# Patient Record
Sex: Male | Born: 1960 | Race: White | Hispanic: No | Marital: Married | Smoking: Former smoker
Health system: Southern US, Community
[De-identification: ages and names within clinical notes are randomized; demographics above are authoritative.]

## PROBLEM LIST (undated history)

## (undated) DIAGNOSIS — E119 Type 2 diabetes mellitus without complications: Secondary | ICD-10-CM

## (undated) DIAGNOSIS — J45909 Unspecified asthma, uncomplicated: Secondary | ICD-10-CM

## (undated) DIAGNOSIS — I2699 Other pulmonary embolism without acute cor pulmonale: Secondary | ICD-10-CM

## (undated) DIAGNOSIS — G4730 Sleep apnea, unspecified: Secondary | ICD-10-CM

## (undated) DIAGNOSIS — I10 Essential (primary) hypertension: Secondary | ICD-10-CM

## (undated) DIAGNOSIS — J449 Chronic obstructive pulmonary disease, unspecified: Secondary | ICD-10-CM

## (undated) DIAGNOSIS — F319 Bipolar disorder, unspecified: Secondary | ICD-10-CM

## (undated) HISTORY — DX: Type 2 diabetes mellitus without complications: E11.9

## (undated) HISTORY — PX: APPENDECTOMY: SHX54

## (undated) HISTORY — DX: Essential (primary) hypertension: I10

## (undated) HISTORY — DX: Chronic obstructive pulmonary disease, unspecified: J44.9

## (undated) HISTORY — DX: Sleep apnea, unspecified: G47.30

## (undated) HISTORY — DX: Other pulmonary embolism without acute cor pulmonale: I26.99

---

## 2001-02-19 ENCOUNTER — Encounter: Payer: Self-pay | Admitting: *Deleted

## 2001-02-19 ENCOUNTER — Emergency Department (HOSPITAL_COMMUNITY): Admission: EM | Admit: 2001-02-19 | Discharge: 2001-02-19 | Payer: Self-pay | Admitting: *Deleted

## 2001-07-12 ENCOUNTER — Emergency Department (HOSPITAL_COMMUNITY): Admission: EM | Admit: 2001-07-12 | Discharge: 2001-07-12 | Payer: Self-pay | Admitting: *Deleted

## 2001-07-30 ENCOUNTER — Ambulatory Visit (HOSPITAL_COMMUNITY): Admission: RE | Admit: 2001-07-30 | Discharge: 2001-07-30 | Payer: Self-pay | Admitting: Unknown Physician Specialty

## 2001-07-30 ENCOUNTER — Encounter: Payer: Self-pay | Admitting: Pediatrics

## 2002-09-21 ENCOUNTER — Emergency Department (HOSPITAL_COMMUNITY): Admission: EM | Admit: 2002-09-21 | Discharge: 2002-09-21 | Payer: Self-pay | Admitting: *Deleted

## 2002-09-30 ENCOUNTER — Emergency Department (HOSPITAL_COMMUNITY): Admission: EM | Admit: 2002-09-30 | Discharge: 2002-09-30 | Payer: Self-pay | Admitting: Emergency Medicine

## 2002-10-15 ENCOUNTER — Emergency Department (HOSPITAL_COMMUNITY): Admission: EM | Admit: 2002-10-15 | Discharge: 2002-10-15 | Payer: Self-pay | Admitting: Internal Medicine

## 2004-06-01 ENCOUNTER — Emergency Department (HOSPITAL_COMMUNITY): Admission: EM | Admit: 2004-06-01 | Discharge: 2004-06-01 | Payer: Self-pay | Admitting: Emergency Medicine

## 2005-03-21 ENCOUNTER — Emergency Department (HOSPITAL_COMMUNITY): Admission: EM | Admit: 2005-03-21 | Discharge: 2005-03-21 | Payer: Self-pay | Admitting: Emergency Medicine

## 2005-03-30 ENCOUNTER — Ambulatory Visit (HOSPITAL_COMMUNITY): Admission: RE | Admit: 2005-03-30 | Discharge: 2005-03-30 | Payer: Self-pay | Admitting: Emergency Medicine

## 2005-04-18 ENCOUNTER — Ambulatory Visit (HOSPITAL_COMMUNITY): Admission: RE | Admit: 2005-04-18 | Discharge: 2005-04-18 | Payer: Self-pay | Admitting: Emergency Medicine

## 2006-02-13 ENCOUNTER — Emergency Department (HOSPITAL_COMMUNITY): Admission: EM | Admit: 2006-02-13 | Discharge: 2006-02-13 | Payer: Self-pay | Admitting: Emergency Medicine

## 2006-07-17 ENCOUNTER — Emergency Department (HOSPITAL_COMMUNITY): Admission: EM | Admit: 2006-07-17 | Discharge: 2006-07-17 | Payer: Self-pay | Admitting: Emergency Medicine

## 2006-08-09 ENCOUNTER — Emergency Department (HOSPITAL_COMMUNITY): Admission: EM | Admit: 2006-08-09 | Discharge: 2006-08-09 | Payer: Self-pay | Admitting: Emergency Medicine

## 2007-08-30 ENCOUNTER — Emergency Department (HOSPITAL_COMMUNITY): Admission: EM | Admit: 2007-08-30 | Discharge: 2007-08-30 | Payer: Self-pay | Admitting: Emergency Medicine

## 2008-06-16 ENCOUNTER — Emergency Department (HOSPITAL_COMMUNITY): Admission: EM | Admit: 2008-06-16 | Discharge: 2008-06-16 | Payer: Self-pay | Admitting: Emergency Medicine

## 2008-09-01 ENCOUNTER — Emergency Department (HOSPITAL_COMMUNITY): Admission: EM | Admit: 2008-09-01 | Discharge: 2008-09-01 | Payer: Self-pay | Admitting: Emergency Medicine

## 2009-03-28 ENCOUNTER — Emergency Department (HOSPITAL_COMMUNITY): Admission: EM | Admit: 2009-03-28 | Discharge: 2009-03-28 | Payer: Self-pay | Admitting: Emergency Medicine

## 2009-04-01 ENCOUNTER — Emergency Department (HOSPITAL_COMMUNITY): Admission: EM | Admit: 2009-04-01 | Discharge: 2009-04-01 | Payer: Self-pay | Admitting: Emergency Medicine

## 2009-05-14 ENCOUNTER — Emergency Department (HOSPITAL_COMMUNITY): Admission: EM | Admit: 2009-05-14 | Discharge: 2009-05-14 | Payer: Self-pay | Admitting: Emergency Medicine

## 2009-06-23 ENCOUNTER — Emergency Department (HOSPITAL_COMMUNITY): Admission: EM | Admit: 2009-06-23 | Discharge: 2009-06-23 | Payer: Self-pay | Admitting: Emergency Medicine

## 2010-01-18 ENCOUNTER — Emergency Department (HOSPITAL_COMMUNITY): Admission: EM | Admit: 2010-01-18 | Discharge: 2010-01-18 | Payer: Self-pay | Admitting: Emergency Medicine

## 2010-06-24 ENCOUNTER — Emergency Department (HOSPITAL_COMMUNITY): Admission: EM | Admit: 2010-06-24 | Discharge: 2010-06-24 | Payer: Self-pay | Admitting: Emergency Medicine

## 2010-10-10 ENCOUNTER — Emergency Department (HOSPITAL_COMMUNITY)
Admission: EM | Admit: 2010-10-10 | Discharge: 2010-10-10 | Payer: Self-pay | Source: Home / Self Care | Admitting: Emergency Medicine

## 2011-01-19 LAB — GLUCOSE, CAPILLARY: Glucose-Capillary: 128 mg/dL — ABNORMAL HIGH (ref 70–99)

## 2011-02-04 LAB — URINALYSIS, ROUTINE W REFLEX MICROSCOPIC
Nitrite: NEGATIVE
Protein, ur: NEGATIVE mg/dL
pH: 5.5 (ref 5.0–8.0)

## 2011-02-04 LAB — GLUCOSE, CAPILLARY: Glucose-Capillary: 103 mg/dL — ABNORMAL HIGH (ref 70–99)

## 2011-03-16 ENCOUNTER — Emergency Department (HOSPITAL_COMMUNITY)
Admission: EM | Admit: 2011-03-16 | Discharge: 2011-03-16 | Disposition: A | Discharge status: Home / Self Care | Payer: Self-pay | Attending: Emergency Medicine | Admitting: Emergency Medicine

## 2011-03-16 ENCOUNTER — Emergency Department (HOSPITAL_COMMUNITY): Payer: Self-pay

## 2011-03-16 DIAGNOSIS — R079 Chest pain, unspecified: Secondary | ICD-10-CM | POA: Insufficient documentation

## 2011-03-16 DIAGNOSIS — M79609 Pain in unspecified limb: Secondary | ICD-10-CM | POA: Insufficient documentation

## 2011-03-16 LAB — CBC
HCT: 43.5 % (ref 39.0–52.0)
Hemoglobin: 14.9 g/dL (ref 13.0–17.0)
MCH: 30.2 pg (ref 26.0–34.0)
MCV: 88.2 fL (ref 78.0–100.0)

## 2011-03-16 LAB — DIFFERENTIAL
Basophils Relative: 0 % (ref 0–1)
Eosinophils Absolute: 0.2 10*3/uL (ref 0.0–0.7)
Lymphocytes Relative: 23 % (ref 12–46)
Monocytes Absolute: 0.7 10*3/uL (ref 0.1–1.0)
Monocytes Relative: 7 % (ref 3–12)
Neutrophils Relative %: 68 % (ref 43–77)

## 2011-03-16 LAB — BASIC METABOLIC PANEL
BUN: 8 mg/dL (ref 6–23)
CO2: 25 mEq/L (ref 19–32)
Calcium: 9.7 mg/dL (ref 8.4–10.5)
GFR calc Af Amer: >60 mL/min (ref >60)
Potassium: 3.7 mEq/L (ref 3.5–5.1)
Sodium: 137 mEq/L (ref 135–145)

## 2011-03-16 LAB — POCT CARDIAC MARKERS: Myoglobin, poc: 70.2 ng/mL (ref 12–200)

## 2011-03-16 NOTE — Consult Note (Signed)
[facility]  Patient:    Gary Burke, Gary Burke Visit Number: [id number] MRN: [id number]          Service Type: Attending:  Barbaraann Barthel, M.D. Dictated by:   Barbaraann Barthel, M.D. Proc. Date: 07/31/01   CC:         free clinic   Consultation Report  Dear free clinic:  I saw Gary Burke in my office on July 31, 2001 at which time I evaluated his perianal area and intergluteal space.  In essence, he has an area of tenderness; however, he has very minimal erythema and I do not palpate either a cyst or an abscess in this area.  I do not believe that he has a pilonidal abscess or a cyst at this time.  He may have presented with one earlier the antibiotics have resolved any cellulitis that he has.  I told him to continue warm soaks and a weeks course of antibiotics and I will reevaluate him to see if surgery is necessary.  He is told to contact us if he has any acute change which would prompt me to see him earlier.  At any rate, I do not believe surgery is necessary at this time.  Thank you for sending him my way.  I am, as always, happy to help the free clinic. Dictated by:   Barbaraann Barthel, M.D. Attending:  Barbaraann Barthel, M.D. DD:  07/31/01 TD:  08/01/01 Job: [id number]

## 2011-03-18 ENCOUNTER — Emergency Department (HOSPITAL_COMMUNITY): Payer: Self-pay

## 2011-03-18 ENCOUNTER — Emergency Department (HOSPITAL_COMMUNITY)
Admission: EM | Admit: 2011-03-18 | Discharge: 2011-03-18 | Disposition: A | Discharge status: Home / Self Care | Payer: Self-pay | Attending: Emergency Medicine | Admitting: Emergency Medicine

## 2011-03-18 DIAGNOSIS — F3130 Bipolar disorder, current episode depressed, mild or moderate severity, unspecified: Secondary | ICD-10-CM | POA: Insufficient documentation

## 2011-03-18 DIAGNOSIS — R079 Chest pain, unspecified: Secondary | ICD-10-CM | POA: Insufficient documentation

## 2011-03-18 DIAGNOSIS — Z79899 Other long term (current) drug therapy: Secondary | ICD-10-CM | POA: Insufficient documentation

## 2011-03-18 LAB — DIFFERENTIAL
Basophils Absolute: 0 10*3/uL (ref 0.0–0.1)
Basophils Relative: 0 % (ref 0–1)
Lymphs Abs: 3.3 10*3/uL (ref 0.7–4.0)
Monocytes Relative: 8 % (ref 3–12)
Neutrophils Relative %: 54 % (ref 43–77)

## 2011-03-18 LAB — CBC
MCH: 30.3 pg (ref 26.0–34.0)
MCV: 88.6 fL (ref 78.0–100.0)
WBC: 9.8 10*3/uL (ref 4.0–10.5)

## 2011-03-18 LAB — POCT I-STAT, CHEM 8
Chloride: 107 mEq/L (ref 96–112)
TCO2: 24 mmol/L (ref 0–100)

## 2011-03-18 LAB — POCT CARDIAC MARKERS
CKMB, poc: <1 ng/mL — ABNORMAL LOW (ref 1.0–8.0)
Troponin i, poc: <0.05 ng/mL (ref 0.00–0.09)

## 2012-07-08 ENCOUNTER — Encounter (HOSPITAL_COMMUNITY): Payer: Self-pay | Admitting: *Deleted

## 2012-07-08 ENCOUNTER — Emergency Department (HOSPITAL_COMMUNITY)
Admission: EM | Admit: 2012-07-08 | Discharge: 2012-07-08 | Disposition: A | Discharge status: Home / Self Care | Payer: Self-pay | Attending: Emergency Medicine | Admitting: Emergency Medicine

## 2012-07-08 ENCOUNTER — Emergency Department (HOSPITAL_COMMUNITY): Payer: Self-pay

## 2012-07-08 DIAGNOSIS — J40 Bronchitis, not specified as acute or chronic: Secondary | ICD-10-CM | POA: Insufficient documentation

## 2012-07-08 DIAGNOSIS — Z9089 Acquired absence of other organs: Secondary | ICD-10-CM | POA: Insufficient documentation

## 2012-07-08 DIAGNOSIS — F17200 Nicotine dependence, unspecified, uncomplicated: Secondary | ICD-10-CM | POA: Insufficient documentation

## 2012-07-08 DIAGNOSIS — J45909 Unspecified asthma, uncomplicated: Secondary | ICD-10-CM | POA: Insufficient documentation

## 2012-07-08 HISTORY — DX: Bipolar disorder, unspecified: F31.9

## 2012-07-08 MED ORDER — HYDROCODONE-ACETAMINOPHEN 7.5-500 MG/15ML PO SOLN: 15.0000 mL | Freq: Four times a day (QID) | ORAL | Status: AC | PRN

## 2012-07-08 MED ORDER — ALBUTEROL SULFATE HFA 108 (90 BASE) MCG/ACT IN AERS
2.0000 | INHALATION_SPRAY | RESPIRATORY_TRACT | Status: DC | PRN
  Administered 2012-07-08: 2 via RESPIRATORY_TRACT
  Filled 2012-07-08: qty 6.7

## 2012-07-08 MED ORDER — ALBUTEROL SULFATE (2.5 MG/3ML) 0.083% IN NEBU: 2.5000 mg | INHALATION_SOLUTION | Freq: Four times a day (QID) | RESPIRATORY_TRACT | Status: DC | PRN

## 2012-07-08 MED ORDER — ALBUTEROL SULFATE (5 MG/ML) 0.5% IN NEBU
5.0000 mg | INHALATION_SOLUTION | Freq: Once | RESPIRATORY_TRACT | Status: AC
  Administered 2012-07-08: 5 mg via RESPIRATORY_TRACT
  Filled 2012-07-08: qty 1

## 2012-07-08 MED ORDER — HYDROCOD POLST-CHLORPHEN POLST 10-8 MG/5ML PO LQCR
5.0000 mL | Freq: Once | ORAL | Status: AC
  Administered 2012-07-08: 5 mL via ORAL
  Filled 2012-07-08: qty 5

## 2012-07-08 MED ORDER — IPRATROPIUM BROMIDE 0.02 % IN SOLN
0.5000 mg | Freq: Once | RESPIRATORY_TRACT | Status: AC
  Administered 2012-07-08: 0.5 mg via RESPIRATORY_TRACT
  Filled 2012-07-08: qty 2.5

## 2012-07-08 MED ORDER — AZITHROMYCIN 250 MG PO TABS: ORAL_TABLET | ORAL | Status: AC

## 2012-07-08 NOTE — ED Notes (Signed)
Pt c/o cough, with sputum production white to yellow in color, fever, chest pain when coughing. Pt states that he started getting sick on Sunday.

## 2012-07-08 NOTE — ED Provider Notes (Signed)
History    This chart was scribed for Gary Hutching, MD, MD by Smitty Pluck. The patient was seen in room APA09 and the patient's care was started at 10:14AM.   CSN: [id number]  Arrival date & time 07/08/12  0908   First MD Initiated Contact with Patient 07/08/12 1014      Chief Complaint  Patient presents with  . Cough  . Fever    (Consider location/radiation/quality/duration/timing/severity/associated sxs/prior treatment) Patient is a 51 y.o. male presenting with cough and fever. The history is provided by the patient. No language interpreter was used.  Cough  Fever Primary symptoms of the febrile illness include fever and cough.   JEOFFREY Burke is a 51 y.o. male who presents to the Emergency Department complaining of moderate productive cough causing lateral chest wall pain onset 2 days ago. Pt reports that he has mild intermittent wheezing and fever. Pt reports that he smokes 1 pack/week.   Pt does not have PCP  Past Medical History  Diagnosis Date  . Bipolar disorder     Past Surgical History  Procedure Date  . Appendectomy     No family history on file.  History  Substance Use Topics  . Smoking status: Current Some Day Smoker  . Smokeless tobacco: Not on file  . Alcohol Use: No      Review of Systems  Constitutional: Positive for fever.  Respiratory: Positive for cough.   All other systems reviewed and are negative.   10 Systems reviewed and all are negative for acute change except as noted in the HPI.   Allergies  Penicillins  Home Medications   Current Outpatient Rx  Name Route Sig Dispense Refill  . ACETAMINOPHEN 500 MG PO TABS Oral Take 500 mg by mouth every 6 (six) hours as needed. Pain    . DAYQUIL PO Oral Take 2 capsules by mouth every 4 (four) hours as needed. Cold Symptoms.      BP 147/96  Pulse 71  Temp 98.4 F (36.9 C)  Resp 20  Ht 6' (1.829 m)  Wt 280 lb (127.007 kg)  BMI 37.97 kg/m2  SpO2 97%  Physical Exam  Nursing note  and vitals reviewed. Constitutional: He is oriented to person, place, and time. He appears well-developed and well-nourished.  HENT:  Head: Normocephalic and atraumatic.  Eyes: Conjunctivae and EOM are normal. Pupils are equal, round, and reactive to light.  Neck: Normal range of motion. Neck supple.  Cardiovascular: Normal rate, regular rhythm and normal heart sounds.   Pulmonary/Chest: Effort normal. He has wheezes (minimal ).  Abdominal: Soft. Bowel sounds are normal.  Musculoskeletal: Normal range of motion.  Neurological: He is alert and oriented to person, place, and time.  Skin: Skin is warm and dry.  Psychiatric: He has a normal mood and affect.    ED Course  Procedures (including critical care time) DIAGNOSTIC STUDIES: Oxygen Saturation is 97% on room air, normal by my interpretation.    COORDINATION OF CARE: 10:17 AM Discussed pt ED treatment with pt (breathing treatment, abx, cough medication and inhaler, home nebulizer meds) 10:46 AM Ordered:   Medications  acetaminophen (TYLENOL) 500 MG tablet (not administered)  Pseudoephedrine-APAP-DM (DAYQUIL PO) (not administered)  albuterol (PROVENTIL HFA;VENTOLIN HFA) 108 (90 BASE) MCG/ACT inhaler 2 puff (2 puff Inhalation Given 07/08/12 1050)  albuterol (PROVENTIL) (5 MG/ML) 0.5% nebulizer solution 5 mg (5 mg Nebulization Given 07/08/12 1049)  ipratropium (ATROVENT) nebulizer solution 0.5 mg (0.5 mg Nebulization Given 07/08/12 1049)  chlorpheniramine-HYDROcodone (  TUSSIONEX) 10-8 MG/5ML suspension 5 mL (5 mL Oral Given 07/08/12 1039)       Labs Reviewed - No data to display Dg Chest 2 View  07/08/2012  *RADIOLOGY REPORT*  Clinical Data: Cough, congestion, smoker.  CHEST - 2 VIEW  Comparison: 03/18/2011  Findings: Heart is normal size.  There is peribronchial thickening, stable since prior study.  No confluent opacity or effusions.  No acute bony abnormality.  IMPRESSION: Stable chronic bronchitic changes.  No active disease.    Original Report Authenticated By: Cyndie Chime, M.D.      No diagnosis found.    MDM  Chest x-ray shows no pneumonia.  History and physical consistent with bronchitis.  Rx Zithromax, albuterol inhaler, Lortab elixir for cough      I personally performed the services described in this documentation, which was scribed in my presence. The recorded information has been reviewed and considered.    Gary Hutching, MD 07/08/12 1153

## 2012-07-08 NOTE — ED Notes (Signed)
Pt up in bathroom, family member at bedside. Awaiting d/c papers

## 2012-10-08 ENCOUNTER — Emergency Department (HOSPITAL_COMMUNITY): Payer: Self-pay

## 2012-10-08 ENCOUNTER — Emergency Department (HOSPITAL_COMMUNITY)
Admission: EM | Admit: 2012-10-08 | Discharge: 2012-10-08 | Disposition: A | Discharge status: Home / Self Care | Payer: Self-pay | Attending: Emergency Medicine | Admitting: Emergency Medicine

## 2012-10-08 ENCOUNTER — Encounter (HOSPITAL_COMMUNITY): Payer: Self-pay | Admitting: Emergency Medicine

## 2012-10-08 DIAGNOSIS — Y9301 Activity, walking, marching and hiking: Secondary | ICD-10-CM | POA: Insufficient documentation

## 2012-10-08 DIAGNOSIS — Y929 Unspecified place or not applicable: Secondary | ICD-10-CM | POA: Insufficient documentation

## 2012-10-08 DIAGNOSIS — F319 Bipolar disorder, unspecified: Secondary | ICD-10-CM | POA: Insufficient documentation

## 2012-10-08 DIAGNOSIS — Z7982 Long term (current) use of aspirin: Secondary | ICD-10-CM | POA: Insufficient documentation

## 2012-10-08 DIAGNOSIS — F17200 Nicotine dependence, unspecified, uncomplicated: Secondary | ICD-10-CM | POA: Insufficient documentation

## 2012-10-08 DIAGNOSIS — S335XXA Sprain of ligaments of lumbar spine, initial encounter: Secondary | ICD-10-CM | POA: Insufficient documentation

## 2012-10-08 DIAGNOSIS — W010XXA Fall on same level from slipping, tripping and stumbling without subsequent striking against object, initial encounter: Secondary | ICD-10-CM | POA: Insufficient documentation

## 2012-10-08 DIAGNOSIS — S39012A Strain of muscle, fascia and tendon of lower back, initial encounter: Secondary | ICD-10-CM

## 2012-10-08 MED ORDER — OXYCODONE-ACETAMINOPHEN 5-325 MG PO TABS: 1.0000 | ORAL_TABLET | ORAL | Status: AC | PRN

## 2012-10-08 MED ORDER — CYCLOBENZAPRINE HCL 10 MG PO TABS: 10.0000 mg | ORAL_TABLET | Freq: Three times a day (TID) | ORAL | Status: DC | PRN

## 2012-10-08 MED ORDER — CYCLOBENZAPRINE HCL 10 MG PO TABS
10.0000 mg | ORAL_TABLET | Freq: Once | ORAL | Status: AC
  Administered 2012-10-08: 10 mg via ORAL
  Filled 2012-10-08: qty 1

## 2012-10-08 MED ORDER — OXYCODONE-ACETAMINOPHEN 5-325 MG PO TABS
1.0000 | ORAL_TABLET | Freq: Once | ORAL | Status: AC
  Administered 2012-10-08: 1 via ORAL
  Filled 2012-10-08: qty 1

## 2012-10-08 NOTE — ED Notes (Signed)
Pt slid and fell yesterday onto right back and buttock.

## 2012-10-08 NOTE — ED Provider Notes (Signed)
History     CSN: [id number]  Arrival date & time 10/08/12  1521   First MD Initiated Contact with Patient 10/08/12 1604      Chief Complaint  Patient presents with  . Fall    (Consider location/radiation/quality/duration/timing/severity/associated sxs/prior treatment) HPI Comments: Patient c/o pain to his right lower back and buttocks after slipping on some ice.  States the pain is worse with certain movements and improves with rest.  He has tried OTC Tylenol w/o improvement.  He denies radiation of pain, dysuria, incontinence, saddle anesthesia's, abd pain, extremity pain or numbness.  He also denies neck pain, head injury or LOC.   Patient is a 51 y.o. male presenting with fall. The history is provided by the patient.  Fall The accident occurred yesterday. The fall occurred while walking. He landed on concrete. There was no blood loss. Point of impact: right lower back and buttocks. Pain location: right lower back and buttocks. The pain is moderate. He was ambulatory at the scene. There was no entrapment after the fall. There was no drug use involved in the accident. There was no alcohol use involved in the accident. Pertinent negatives include no fever, no numbness, no abdominal pain, no bowel incontinence, no nausea, no vomiting, no hematuria, no headaches, no hearing loss, no loss of consciousness and no tingling. The symptoms are aggravated by activity, ambulation, sitting and pressure on the injury. He has tried acetaminophen for the symptoms. The treatment provided no relief.    Past Medical History  Diagnosis Date  . Bipolar disorder     Past Surgical History  Procedure Date  . Appendectomy     No family history on file.  History  Substance Use Topics  . Smoking status: Current Some Day Smoker  . Smokeless tobacco: Not on file  . Alcohol Use: No      Review of Systems  Constitutional: Negative for fever and activity change.  HENT: Negative for neck pain.     Respiratory: Negative for shortness of breath.   Cardiovascular: Negative for chest pain.  Gastrointestinal: Negative for nausea, vomiting, abdominal pain and bowel incontinence.  Genitourinary: Negative for dysuria, hematuria, flank pain, decreased urine volume and difficulty urinating.       No perineal numbness or incontinence of urine or feces  Musculoskeletal: Positive for back pain. Negative for joint swelling and gait problem.  Skin: Negative for rash.  Neurological: Negative for dizziness, tingling, loss of consciousness, weakness, numbness and headaches.  All other systems reviewed and are negative.    Allergies  Penicillins  Home Medications   Current Outpatient Rx  Name  Route  Sig  Dispense  Refill  . ACETAMINOPHEN 500 MG PO TABS   Oral   Take 500 mg by mouth every 6 (six) hours as needed. Pain         . ALBUTEROL SULFATE (2.5 MG/3ML) 0.083% IN NEBU   Nebulization   Take 3 mLs (2.5 mg total) by nebulization every 6 (six) hours as needed for wheezing.   75 mL   12   . DAYQUIL PO   Oral   Take 2 capsules by mouth every 4 (four) hours as needed. Cold Symptoms.           BP 125/79  Pulse 106  Temp 97.9 F (36.6 C) (Oral)  Resp 20  Ht 6' (1.829 m)  Wt 280 lb (127.007 kg)  BMI 37.97 kg/m2  SpO2 96%  Physical Exam  Nursing note and vitals reviewed.  Constitutional: He is oriented to person, place, and time. He appears well-developed and well-nourished. No distress.  HENT:  Head: Normocephalic and atraumatic.  Neck: Normal range of motion. Neck supple.  Cardiovascular: Normal rate, regular rhythm, normal heart sounds and intact distal pulses.   No murmur heard. Pulmonary/Chest: Effort normal and breath sounds normal. He exhibits no tenderness.  Abdominal: He exhibits no distension and no mass. There is no tenderness. There is no rebound and no guarding.  Musculoskeletal: He exhibits tenderness. He exhibits no edema.       Lumbar back: He exhibits  tenderness and pain. He exhibits normal range of motion, no swelling, no deformity, no laceration and normal pulse.       Back:       ttp of the right lumbar paraspinal muscles and SI joint.  Dp pulses are equal and brisk, distal sensation intact.  Pt has full ROM of the bilateral hips, nml SLR exam  Neurological: He is alert and oriented to person, place, and time. No sensory deficit. He exhibits normal muscle tone. Coordination and gait normal.  Reflex Scores:      Patellar reflexes are 2+ on the right side and 2+ on the left side.      Achilles reflexes are 2+ on the right side and 2+ on the left side. Skin: Skin is warm and dry.    ED Course  Procedures (including critical care time)  Labs Reviewed - No data to display No results found.   Dg Lumbar Spine Complete  10/08/2012  *RADIOLOGY REPORT*  Clinical Data: Fall 1 day ago.  Low back pain.  Left gluteal pain.  LUMBAR SPINE - COMPLETE 4+ VIEW  Comparison: None.  Findings: Lumbar spinal alignment anatomic.  Mild to moderate multilevel thoracolumbar spondylosis is present with degenerative endplate changes and endplate spurring.  There are no pars defects. Five lumbar type vertebral bodies are present.  Lumbosacral junction appears within normal limits.  Lower lumbar facet arthrosis.  Vertebral body height is preserved.  IMPRESSION: No acute abnormality.  Mild to moderate multilevel lumbar spondylosis.   Original Report Authenticated By: Andreas Newport, M.D.      MDM     Patient has ttp of the right lumbar paraspinal muscles and SI joint.  No focal neuro deficits on exam.  Ambulates with a steady gait.     Given po percocet and flexeril in dept, pain improved.  Likely musculoskeletal injury.  Doubt emergent neurological or infectious process  Pt agrees to close f/u with his PMD for recheck   Prescribed: Flexeril Percocet #15   Rashon Rezek L. Jaylise Peek, [place] 10/08/12 1728

## 2012-10-11 NOTE — ED Provider Notes (Signed)
Medical screening examination/treatment/procedure(s) were performed by non-physician practitioner and as supervising physician I was immediately available for consultation/collaboration.   Joya Gaskins, MD 10/11/12 [phone]

## 2012-12-07 ENCOUNTER — Emergency Department (HOSPITAL_COMMUNITY): Payer: Self-pay

## 2012-12-07 ENCOUNTER — Emergency Department (HOSPITAL_COMMUNITY)
Admission: EM | Admit: 2012-12-07 | Discharge: 2012-12-07 | Disposition: A | Discharge status: Home / Self Care | Payer: Self-pay | Attending: Emergency Medicine | Admitting: Emergency Medicine

## 2012-12-07 ENCOUNTER — Encounter (HOSPITAL_COMMUNITY): Payer: Self-pay

## 2012-12-07 DIAGNOSIS — J329 Chronic sinusitis, unspecified: Secondary | ICD-10-CM | POA: Insufficient documentation

## 2012-12-07 DIAGNOSIS — J069 Acute upper respiratory infection, unspecified: Secondary | ICD-10-CM | POA: Insufficient documentation

## 2012-12-07 DIAGNOSIS — Z8659 Personal history of other mental and behavioral disorders: Secondary | ICD-10-CM | POA: Insufficient documentation

## 2012-12-07 DIAGNOSIS — F17200 Nicotine dependence, unspecified, uncomplicated: Secondary | ICD-10-CM | POA: Insufficient documentation

## 2012-12-07 DIAGNOSIS — J3489 Other specified disorders of nose and nasal sinuses: Secondary | ICD-10-CM | POA: Insufficient documentation

## 2012-12-07 DIAGNOSIS — IMO0001 Reserved for inherently not codable concepts without codable children: Secondary | ICD-10-CM | POA: Insufficient documentation

## 2012-12-07 DIAGNOSIS — J029 Acute pharyngitis, unspecified: Secondary | ICD-10-CM | POA: Insufficient documentation

## 2012-12-07 MED ORDER — PROMETHAZINE-CODEINE 6.25-10 MG/5ML PO SYRP: 5.0000 mL | ORAL_SOLUTION | ORAL | Status: DC | PRN

## 2012-12-07 MED ORDER — PSEUDOEPHEDRINE HCL 60 MG PO TABS: ORAL_TABLET | ORAL | Status: DC

## 2012-12-07 NOTE — ED Notes (Signed)
Flu-like symptoms for 4 days, has been taking otc meds with no relief

## 2012-12-07 NOTE — ED Provider Notes (Signed)
History     CSN: [id number]  Arrival date & time 12/07/12  1046   First MD Initiated Contact with Patient 12/07/12 1227      Chief Complaint  Patient presents with  . Influenza    (Consider location/radiation/quality/duration/timing/severity/associated sxs/prior treatment) Patient is a 52 y.o. male presenting with flu symptoms. The history is provided by the patient.  Influenza Presenting symptoms: cough, myalgias, rhinorrhea and sore throat   Presenting symptoms: no diarrhea, no fever, no headaches and no nausea   Severity:  Moderate Onset quality:  Gradual Progression:  Unchanged Chronicity:  New Relieved by:  Nothing Worsened by:  Nothing tried Ineffective treatments:  Hot fluids and rest Associated symptoms: nasal congestion   Associated symptoms: no chills, no decreased appetite, no decrease in physical activity, no ear pain, no neck stiffness and no witnessed syncope   Risk factors: sick contacts     Past Medical History  Diagnosis Date  . Bipolar disorder     Past Surgical History  Procedure Laterality Date  . Appendectomy      No family history on file.  History  Substance Use Topics  . Smoking status: Current Some Day Smoker    Types: Cigarettes  . Smokeless tobacco: Not on file  . Alcohol Use: No      Review of Systems  Constitutional: Negative for fever, chills and decreased appetite.  HENT: Positive for congestion, sore throat and rhinorrhea. Negative for ear pain and neck stiffness.   Respiratory: Positive for cough.   Gastrointestinal: Negative for nausea and diarrhea.  Musculoskeletal: Positive for myalgias.  Neurological: Negative for headaches.    Allergies  Penicillins  Home Medications  No current outpatient prescriptions on file.  BP 137/93  Pulse 98  Temp(Src) 97.9 F (36.6 C) (Oral)  Resp 20  Ht 6' (1.829 m)  Wt 280 lb (127.007 kg)  BMI 37.97 kg/m2  SpO2 97%  Physical Exam  Nursing note and vitals  reviewed. Constitutional: He is oriented to person, place, and time. He appears well-developed and well-nourished.  Non-toxic appearance.  HENT:  Head: Normocephalic.  Right Ear: Tympanic membrane and external ear normal.  Left Ear: Tympanic membrane and external ear normal.  Nasal congestion.  Eyes: EOM and lids are normal. Pupils are equal, round, and reactive to light.  Neck: Normal range of motion. Neck supple. Carotid bruit is not present.  Cardiovascular: Normal rate, regular rhythm, normal heart sounds, intact distal pulses and normal pulses.   Pulmonary/Chest: No respiratory distress. He has rhonchi.  Abdominal: Soft. Bowel sounds are normal. There is no tenderness. There is no guarding.  Musculoskeletal: Normal range of motion.  Lymphadenopathy:       Head (right side): No submandibular adenopathy present.       Head (left side): No submandibular adenopathy present.    He has no cervical adenopathy.  Neurological: He is alert and oriented to person, place, and time. He has normal strength. No cranial nerve deficit or sensory deficit.  Skin: Skin is warm and dry.  Psychiatric: He has a normal mood and affect. His speech is normal.    ED Course  Procedures (including critical care time)  Labs Reviewed - No data to display Dg Chest 2 View  12/07/2012  *RADIOLOGY REPORT*  Clinical Data: Cough, shortness of breath, fluid  CHEST - 2 VIEW  Comparison: Chest x-ray of 07/08/2012  Findings: No pneumonia is seen.  Again chronic changes of bronchitis are noted with prominent perihilar markings and peribronchial thickening.  Mild volume loss at the right lung base is present.  Cardiomegaly is stable.  There are degenerative changes in the lower thoracic spine.  IMPRESSION: No pneumonia.  Chronic bronchitis.   Original Report Authenticated By: Dwyane Dee, M.D.      No diagnosis found.    MDM  I have reviewed nursing notes, vital signs, and all appropriate lab and imaging results for  this patient.** Vital signs wnl. Pt ambulatory without problem. Speaks in complete sentences. Exam is consistent with URI. Plan Promethazine cough medicine. Sudafed for congestion.  Chest xray is negative for pnuemonia. Chronic bronchitis noted,      Kathie Dike, [place] 12/12/12 1353

## 2012-12-12 NOTE — ED Provider Notes (Signed)
Medical screening examination/treatment/procedure(s) were performed by non-physician practitioner and as supervising physician I was immediately available for consultation/collaboration.   Caci Orren M Greycen Felter, DO 12/12/12 2340 

## 2013-06-05 ENCOUNTER — Emergency Department (HOSPITAL_COMMUNITY)
Admission: EM | Admit: 2013-06-05 | Discharge: 2013-06-05 | Disposition: A | Discharge status: Home / Self Care | Payer: Self-pay | Attending: Emergency Medicine | Admitting: Emergency Medicine

## 2013-06-05 ENCOUNTER — Emergency Department (HOSPITAL_COMMUNITY): Payer: Self-pay

## 2013-06-05 ENCOUNTER — Encounter (HOSPITAL_COMMUNITY): Payer: Self-pay | Admitting: Emergency Medicine

## 2013-06-05 DIAGNOSIS — F17200 Nicotine dependence, unspecified, uncomplicated: Secondary | ICD-10-CM | POA: Insufficient documentation

## 2013-06-05 DIAGNOSIS — Z880 Allergy status to penicillin: Secondary | ICD-10-CM | POA: Insufficient documentation

## 2013-06-05 DIAGNOSIS — Z8659 Personal history of other mental and behavioral disorders: Secondary | ICD-10-CM | POA: Insufficient documentation

## 2013-06-05 DIAGNOSIS — M109 Gout, unspecified: Secondary | ICD-10-CM | POA: Insufficient documentation

## 2013-06-05 LAB — CBC WITH DIFFERENTIAL/PLATELET
Basophils Absolute: 0 10*3/uL (ref 0.0–0.1)
Basophils Relative: 0 % (ref 0–1)
Eosinophils Absolute: 0.3 10*3/uL (ref 0.0–0.7)
Eosinophils Relative: 2 % (ref 0–5)
HCT: 44.6 % (ref 39.0–52.0)
MCH: 30.6 pg (ref 26.0–34.0)
MCHC: 34.3 g/dL (ref 30.0–36.0)
MCV: 89.2 fL (ref 78.0–100.0)
Monocytes Absolute: 0.9 10*3/uL (ref 0.1–1.0)
RDW: 13.3 % (ref 11.5–15.5)

## 2013-06-05 MED ORDER — HYDROCODONE-ACETAMINOPHEN 5-325 MG PO TABS: 1.0000 | ORAL_TABLET | ORAL | Status: DC | PRN

## 2013-06-05 MED ORDER — HYDROCODONE-ACETAMINOPHEN 5-325 MG PO TABS
1.0000 | ORAL_TABLET | Freq: Once | ORAL | Status: AC
  Administered 2013-06-05: 1 via ORAL
  Filled 2013-06-05: qty 1

## 2013-06-05 MED ORDER — IBUPROFEN 800 MG PO TABS: 800.0000 mg | ORAL_TABLET | Freq: Three times a day (TID) | ORAL | Status: DC

## 2013-06-05 MED ORDER — IBUPROFEN 800 MG PO TABS
800.0000 mg | ORAL_TABLET | Freq: Once | ORAL | Status: AC
  Administered 2013-06-05: 800 mg via ORAL
  Filled 2013-06-05: qty 1

## 2013-06-05 NOTE — ED Provider Notes (Signed)
CSN: [id number]     Arrival date & time 06/05/13  0857 History     First MD Initiated Contact with Patient 06/05/13 [phone]     Chief Complaint  Patient presents with  . Toe Pain   (Consider location/radiation/quality/duration/timing/severity/associated sxs/prior Treatment) HPI Comments: BONNIE ROIG is a 52 y.o. Male presenting with a 24-hour history of left distal great toe pain, redness and swelling.  He describes waking yesterday a morning with his symptoms which are throbbing and aching and fairly constant despite elevation and Tylenol.  He denies injury and denies prior episodes of similar symptoms.  There is no radiation of his pain which is localized to the left great toe.     The history is provided by the patient.    Past Medical History  Diagnosis Date  . Bipolar disorder    Past Surgical History  Procedure Laterality Date  . Appendectomy     History reviewed. No pertinent family history. History  Substance Use Topics  . Smoking status: Current Some Day Smoker    Types: Cigarettes  . Smokeless tobacco: Not on file  . Alcohol Use: No    Review of Systems  Constitutional: Negative for fever.  Musculoskeletal: Positive for joint swelling and arthralgias. Negative for myalgias.  Skin: Positive for color change.  Neurological: Negative for weakness and numbness.    Allergies  Penicillins  Home Medications   Current Outpatient Rx  Name  Route  Sig  Dispense  Refill  . OVER THE COUNTER MEDICATION   Oral   Take 3 tablets by mouth every 4 (four) hours as needed (pain). [employer] Brand Pain Reliever.         Marland Kitchen HYDROcodone-acetaminophen (NORCO/VICODIN) 5-325 MG per tablet   Oral   Take 1 tablet by mouth every 4 (four) hours as needed for pain.   20 tablet   0   . ibuprofen (ADVIL,MOTRIN) 800 MG tablet   Oral   Take 1 tablet (800 mg total) by mouth 3 (three) times daily.   21 tablet   0    BP 146/93  Pulse 80  Temp(Src) 98 F (36.7 C)  Resp 18   Ht 6' (1.829 m)  Wt 268 lb (121.564 kg)  BMI 36.34 kg/m2  SpO2 99% Physical Exam  Constitutional: He appears well-developed and well-nourished.  HENT:  Head: Atraumatic.  Neck: Normal range of motion.  Cardiovascular:  Pulses equal bilaterally  Musculoskeletal: He exhibits edema and tenderness.       Left foot: He exhibits tenderness and swelling. He exhibits normal capillary refill, no crepitus and no deformity.  Tender to palpation left great MTP joint with no appreciable effusion, there is erythema and increased warmth at the site.  Less than 3 second cap refill and distal great toe.  Pedal pulses are intact.  No red streaking, skin is intact with no evidence of bony or skin trauma.  Neurological: He is alert. He has normal strength. He displays normal reflexes. No sensory deficit.  Equal strength  Skin: Skin is warm and dry.  Psychiatric: He has a normal mood and affect.    ED Course   Procedures (including critical care time)  Labs Reviewed  CBC WITH DIFFERENTIAL - Abnormal; Notable for the following:    WBC 12.5 (*)    Neutro Abs 8.4 (*)    All other components within normal limits  URIC ACID - Abnormal; Notable for the following:    Uric Acid, Serum 8.5 (*)  All other components within normal limits   Dg Toe Great Left  06/05/2013   *RADIOLOGY REPORT*  Clinical Data: Pain  LEFT GREAT TOE  Comparison:  August 30, 2007  Findings: Frontal, oblique, and lateral views were obtained.  There is mild erosive arthropathy in the first MTP joint.  There is no fracture or dislocation.  No bony destruction.  No periarticular calcification or periostitis.  IMPRESSION: Narrowing of the first MTP joint with several small erosions. These changes may represent gout; appropriate laboratory correlation advised.  No fracture or dislocation.  No bony destruction.   Original Report Authenticated By: Bretta Bang, M.D.   1. Gout of big toe     MDM  Patients labs and/or radiological  studies were viewed and considered during the medical decision making and disposition process. Pt's exam and labs, xray most c/w with gout.  Doubt joint or skin infection.  Prescribed ibuprofen,  Hydrocodone,  Encouraged elevation, heat.  Referrals given for obtaining pcp.  Burgess Amor, PA-C 06/05/13 1059

## 2013-06-05 NOTE — ED Provider Notes (Signed)
Medical screening examination/treatment/procedure(s) were performed by non-physician practitioner and as supervising physician I was immediately available for consultation/collaboration.   Rachana Malesky, MD 06/05/13 1436 

## 2013-06-05 NOTE — ED Notes (Signed)
Pt c/o left great toe pain/swelling since yesterday am.

## 2014-01-03 ENCOUNTER — Emergency Department (HOSPITAL_COMMUNITY): Payer: Self-pay

## 2014-01-03 ENCOUNTER — Encounter (HOSPITAL_COMMUNITY): Payer: Self-pay | Admitting: Emergency Medicine

## 2014-01-03 ENCOUNTER — Emergency Department (HOSPITAL_COMMUNITY)
Admission: EM | Admit: 2014-01-03 | Discharge: 2014-01-03 | Disposition: A | Discharge status: Home / Self Care | Payer: Self-pay | Attending: Emergency Medicine | Admitting: Emergency Medicine

## 2014-01-03 DIAGNOSIS — J069 Acute upper respiratory infection, unspecified: Secondary | ICD-10-CM | POA: Insufficient documentation

## 2014-01-03 DIAGNOSIS — Z79899 Other long term (current) drug therapy: Secondary | ICD-10-CM | POA: Insufficient documentation

## 2014-01-03 DIAGNOSIS — Z880 Allergy status to penicillin: Secondary | ICD-10-CM | POA: Insufficient documentation

## 2014-01-03 DIAGNOSIS — F17200 Nicotine dependence, unspecified, uncomplicated: Secondary | ICD-10-CM | POA: Insufficient documentation

## 2014-01-03 DIAGNOSIS — Z8659 Personal history of other mental and behavioral disorders: Secondary | ICD-10-CM | POA: Insufficient documentation

## 2014-01-03 MED ORDER — IBUPROFEN 600 MG PO TABS: 600.0000 mg | ORAL_TABLET | Freq: Four times a day (QID) | ORAL | Status: DC | PRN

## 2014-01-03 MED ORDER — ALBUTEROL SULFATE (2.5 MG/3ML) 0.083% IN NEBU: 2.5000 mg | INHALATION_SOLUTION | Freq: Four times a day (QID) | RESPIRATORY_TRACT | Status: DC | PRN

## 2014-01-03 MED ORDER — GUAIFENESIN 100 MG/5ML PO LIQD: 100.0000 mg | ORAL | Status: DC | PRN

## 2014-01-03 NOTE — ED Provider Notes (Signed)
Medical screening examination/treatment/procedure(s) were performed by non-physician practitioner and as supervising physician I was immediately available for consultation/collaboration.   EKG Interpretation None       Dsean Vantol F Sufyan Meidinger, MD 01/03/14 1717 

## 2014-01-03 NOTE — Discharge Instructions (Signed)
Please follow up with your primary care physician in 1-2 days. If you do not have one please call one from list below. Please use your nebulizer machine at home as needed to help with cough and chest tightness. Please take robitussin to help with cough. Please take Motrin to help with body aches and pains. Please read all discharge instructions and return precautions.   Upper Respiratory Infection, Adult An upper respiratory infection (URI) is also sometimes known as the common cold. The upper respiratory tract includes the nose, sinuses, throat, trachea, and bronchi. Bronchi are the airways leading to the lungs. Most people improve within 1 week, but symptoms can last up to 2 weeks. A residual cough may last even longer.  CAUSES Many different viruses can infect the tissues lining the upper respiratory tract. The tissues become irritated and inflamed and often become very moist. Mucus production is also common. A cold is contagious. You can easily spread the virus to others by oral contact. This includes kissing, sharing a glass, coughing, or sneezing. Touching your mouth or nose and then touching a surface, which is then touched by another person, can also spread the virus. SYMPTOMS  Symptoms typically develop 1 to 3 days after you come in contact with a cold virus. Symptoms vary from person to person. They may include:  Runny nose.  Sneezing.  Nasal congestion.  Sinus irritation.  Sore throat.  Loss of voice (laryngitis).  Cough.  Fatigue.  Muscle aches.  Loss of appetite.  Headache.  Low-grade fever. DIAGNOSIS  You might diagnose your own cold based on familiar symptoms, since most people get a cold 2 to 3 times a year. Your caregiver can confirm this based on your exam. Most importantly, your caregiver can check that your symptoms are not due to another disease such as strep throat, sinusitis, pneumonia, asthma, or epiglottitis. Blood tests, throat tests, and X-rays are not  necessary to diagnose a common cold, but they may sometimes be helpful in excluding other more serious diseases. Your caregiver will decide if any further tests are required. RISKS AND COMPLICATIONS  You may be at risk for a more severe case of the common cold if you smoke cigarettes, have chronic heart disease (such as heart failure) or lung disease (such as asthma), or if you have a weakened immune system. The very young and very old are also at risk for more serious infections. Bacterial sinusitis, middle ear infections, and bacterial pneumonia can complicate the common cold. The common cold can worsen asthma and chronic obstructive pulmonary disease (COPD). Sometimes, these complications can require emergency medical care and may be life-threatening. PREVENTION  The best way to protect against getting a cold is to practice good hygiene. Avoid oral or hand contact with people with cold symptoms. Wash your hands often if contact occurs. There is no clear evidence that vitamin C, vitamin E, echinacea, or exercise reduces the chance of developing a cold. However, it is always recommended to get plenty of rest and practice good nutrition. TREATMENT  Treatment is directed at relieving symptoms. There is no cure. Antibiotics are not effective, because the infection is caused by a virus, not by bacteria. Treatment may include:  Increased fluid intake. Sports drinks offer valuable electrolytes, sugars, and fluids.  Breathing heated mist or steam (vaporizer or shower).  Eating chicken soup or other clear broths, and maintaining good nutrition.  Getting plenty of rest.  Using gargles or lozenges for comfort.  Controlling fevers with ibuprofen or acetaminophen  as directed by your caregiver.  Increasing usage of your inhaler if you have asthma. Zinc gel and zinc lozenges, taken in the first 24 hours of the common cold, can shorten the duration and lessen the severity of symptoms. Pain medicines may help  with fever, muscle aches, and throat pain. A variety of non-prescription medicines are available to treat congestion and runny nose. Your caregiver can make recommendations and may suggest nasal or lung inhalers for other symptoms.  HOME CARE INSTRUCTIONS   Only take over-the-counter or prescription medicines for pain, discomfort, or fever as directed by your caregiver.  Use a warm mist humidifier or inhale steam from a shower to increase air moisture. This may keep secretions moist and make it easier to breathe.  Drink enough water and fluids to keep your urine clear or pale yellow.  Rest as needed.  Return to work when your temperature has returned to normal or as your caregiver advises. You may need to stay home longer to avoid infecting others. You can also use a face mask and careful hand washing to prevent spread of the virus. SEEK MEDICAL CARE IF:   After the first few days, you feel you are getting worse rather than better.  You need your caregiver's advice about medicines to control symptoms.  You develop chills, worsening shortness of breath, or brown or red sputum. These may be signs of pneumonia.  You develop yellow or brown nasal discharge or pain in the face, especially when you bend forward. These may be signs of sinusitis.  You develop a fever, swollen neck glands, pain with swallowing, or white areas in the back of your throat. These may be signs of strep throat. SEEK IMMEDIATE MEDICAL CARE IF:   You have a fever.  You develop severe or persistent headache, ear pain, sinus pain, or chest pain.  You develop wheezing, a prolonged cough, cough up blood, or have a change in your usual mucus (if you have chronic lung disease).  You develop sore muscles or a stiff neck. Document Released: 04/10/2001 Document Revised: 01/07/2012 Document Reviewed: 02/16/2011 [facility] Patient Information 2014 [place].  Establish relationship with primary care doctor as discussed. A  resource guide and information on the Affordable Care Act has been provided for your information.    RESOURCE GUIDE  If you do not have a primary care doctor to follow up with regarding today's visit, please call the [facility] at [phone] to make an appointment. Hours of operation are 10am - 7pm, Monday through Friday, and they have a sliding scale fee.   Insufficient Money for Medicine: Contact [employer]:  call "211" or [facility] [phone].  No Primary Care Doctor: - Call [facility]  [phone] - can help you locate a primary care doctor that  accepts your insurance, provides certain services, etc. - Physician Referral [phone]  Agencies that provide inexpensive medical care: - [facility]  [phone] - [facility]  [phone] - [facility]  [phone] [facility]  [phone] - [facility]  [phone] [facility]  [phone]  Medicaid-accepting [facility] [place] Providers: - [facility]- [address]  [phone], Mon-Fri 9am-7pm, Sat 9am-1pm - [facility]- [address], [address] [place]  [phone] - [facility]- [address], [address] [place]  [phone] [facility]- [address]  [phone] -  Renaye Rakers- [address], [address], [phone]  Only accepts [employer] patients after they have their name  applied to their card  Self Pay (no insurance) in [facility]: - Sickle Cell Patients: Dr Willey Blade, [facility]  [address] [place], [phone] - [facility]- [address] [place]  [phone]       [name]- [address]       -     [facility]- see information above (Speak to [employer] if you do not have insurance)       -  [facility]- [address] [place], [phone]        -  [facility]- [address],  [phone]       -  [facility]- [address], [phone]       -  Dr Julio Sicks-  [address], [place], [phone]       -  [facility]- [address], [phone]       -  [facility]- [address], [phone], also [address] [phone]       -    [facility]- [address] [place], [phone], 1st & 3rd Saturday   every month, 10am-1pm  1) Find a Doctor and Pay Out of Pocket Although you won't have to find out who is covered by your insurance plan, it is a good idea to ask around and get recommendations. You will then need to call the office and see if the doctor you have chosen will accept you as a new patient and what types of options they offer for patients who are self-pay. Some doctors offer discounts or will set up payment plans for their patients who do not have insurance, but you will need to ask so you aren't surprised when you get to your appointment.  2) Contact Your Local Health Department Not all health departments have doctors that can see patients for sick visits, but many do, so it is worth a call to see if yours does. If you don't know where your local health department is, you can check in your phone book. The CDC also has a tool to help you locate your state's health department, and many state websites also have listings of all of their local health departments.  3) Find a Walk-in Clinic If your illness is not likely to be very severe or complicated, you may want to try a walk in clinic. These are popping up all over the country in pharmacies, drugstores, and shopping centers. They're usually staffed by nurse practitioners or physician assistants that have been trained to treat common illnesses and complaints. They're usually fairly quick and inexpensive. However, if you have serious medical issues or chronic medical problems, these are probably not your best  option

## 2014-01-03 NOTE — ED Notes (Signed)
Patient c/o cough with chills and body aches x2-3 days. Per patient unsure of fevers. Patient reports thick yellow sputum. Patient reports some chest soreness with cough.

## 2014-01-03 NOTE — ED Provider Notes (Signed)
CSN: [id number]     Arrival date & time 01/03/14  1006 History   First MD Initiated Contact with Patient 01/03/14 1007     Chief Complaint  Patient presents with  . Cough     (Consider location/radiation/quality/duration/timing/severity/associated sxs/prior Treatment) HPI Comments: Patient is a 53 yo M PMHx significant for bipolar disorder, tobacco use presenting to the ED for 2-3 days of non-productive cough with associated rhinorrhea, nasal congestion, myalgias, subjective chills, posttussive chest tightness. Patient states he has been using Nyquil with some improvement of his symptoms. Denies any aggravating factors. Does endorse positive sick contacts at work. Denies any CP, SOB, abdominal pain, nausea, vomiting, or diarrhea.   Patient is a 53 y.o. male presenting with cough.  Cough Associated symptoms: no chest pain, no fever, no shortness of breath and no wheezing     Past Medical History  Diagnosis Date  . Bipolar disorder    Past Surgical History  Procedure Laterality Date  . Appendectomy     History reviewed. No pertinent family history. History  Substance Use Topics  . Smoking status: Current Some Day Smoker -- 0.05 packs/day for 35 years    Types: Cigarettes  . Smokeless tobacco: Never Used  . Alcohol Use: No    Review of Systems  Constitutional: Negative for fever.  Respiratory: Positive for cough and chest tightness. Negative for shortness of breath and wheezing.   Cardiovascular: Negative for chest pain.  Gastrointestinal: Negative for nausea, vomiting, abdominal pain and diarrhea.  All other systems reviewed and are negative.      Allergies  Penicillins  Home Medications   Current Outpatient Rx  Name  Route  Sig  Dispense  Refill  . OVER THE COUNTER MEDICATION   Oral   Take 3 tablets by mouth every 4 (four) hours as needed (pain). [employer] Brand Pain Reliever.         Marland Kitchen albuterol (PROVENTIL) (2.5 MG/3ML) 0.083% nebulizer solution  Nebulization   Take 3 mLs (2.5 mg total) by nebulization every 6 (six) hours as needed for wheezing or shortness of breath (cough).   75 mL   12   . guaiFENesin (ROBITUSSIN) 100 MG/5ML liquid   Oral   Take 5-10 mLs (100-200 mg total) by mouth every 4 (four) hours as needed for cough.   60 mL   0   . ibuprofen (ADVIL,MOTRIN) 600 MG tablet   Oral   Take 1 tablet (600 mg total) by mouth every 6 (six) hours as needed.   30 tablet   0    BP 135/89  Pulse 79  Temp(Src) 98.1 F (36.7 C) (Oral)  Resp 18  Ht 6' (1.829 m)  Wt 260 lb (117.935 kg)  BMI 35.25 kg/m2  SpO2 98% Physical Exam  Constitutional: He is oriented to person, place, and time. He appears well-developed and well-nourished. No distress.  HENT:  Head: Normocephalic and atraumatic.  Right Ear: Hearing, tympanic membrane, external ear and ear canal normal.  Left Ear: Hearing, tympanic membrane, external ear and ear canal normal.  Nose: Nose normal. Right sinus exhibits no maxillary sinus tenderness and no frontal sinus tenderness. Left sinus exhibits no maxillary sinus tenderness and no frontal sinus tenderness.  Mouth/Throat: Uvula is midline, oropharynx is clear and moist and mucous membranes are normal. No oropharyngeal exudate.  Eyes: Conjunctivae are normal.  Neck: Normal range of motion. Neck supple.  Cardiovascular: Normal rate, regular rhythm and normal heart sounds.   Pulmonary/Chest: Effort normal and breath sounds  normal. No respiratory distress.  Abdominal: Soft. Bowel sounds are normal. He exhibits no distension. There is no tenderness. There is no rebound and no guarding.  Musculoskeletal: Normal range of motion. He exhibits no edema.  Lymphadenopathy:    He has no cervical adenopathy.  Neurological: He is alert and oriented to person, place, and time.  Skin: Skin is warm and dry. He is not diaphoretic.  Psychiatric: He has a normal mood and affect.    ED Course  Procedures (including critical care  time) Medications - No data to display  Medications - No data to display  Labs Review Labs Reviewed - No data to display Imaging Review Dg Chest 2 View  01/03/2014   CLINICAL DATA:  Cough and back pain.  EXAM: CHEST  2 VIEW  COMPARISON:  DG CHEST 2 VIEW dated 12/07/2012  FINDINGS: Two views of the chest demonstrate patchy densities in the lower lungs that appear to be chronic. There is no evidence for airspace disease or edema. Heart size is normal. The trachea is midline. No definite pleural effusions.  IMPRESSION: Chronic lung changes without acute findings.   Electronically Signed   By: Richarda OverlieAdam  Henn M.D.   On: 01/03/2014 10:51     EKG Interpretation None      MDM   Final diagnoses:  URI (upper respiratory infection)   Filed Vitals:   01/03/14 1127  BP: 135/89  Pulse: 79  Temp:   Resp:    Afebrile, NAD, non-toxic appearing, AAOx4.   Pt CXR negative for acute infiltrate. Patients symptoms are consistent with URI, likely viral etiology. Discussed that antibiotics are not indicated for viral infections. Pt will be discharged with symptomatic treatment.  Verbalizes understanding and is agreeable with plan. Pt is hemodynamically stable & in NAD prior to dc.     Jeannetta EllisJennifer L Tayshawn Purnell, PA-C 01/03/14 1604

## 2014-01-03 NOTE — ED Notes (Signed)
Pt provided with juice per request.

## 2014-01-04 ENCOUNTER — Emergency Department (HOSPITAL_COMMUNITY)
Admission: EM | Admit: 2014-01-04 | Discharge: 2014-01-04 | Disposition: A | Discharge status: Home / Self Care | Payer: Self-pay | Attending: Emergency Medicine | Admitting: Emergency Medicine

## 2014-01-04 ENCOUNTER — Encounter (HOSPITAL_COMMUNITY): Payer: Self-pay | Admitting: Emergency Medicine

## 2014-01-04 DIAGNOSIS — R000 Tachycardia, unspecified: Secondary | ICD-10-CM | POA: Insufficient documentation

## 2014-01-04 DIAGNOSIS — Z880 Allergy status to penicillin: Secondary | ICD-10-CM | POA: Insufficient documentation

## 2014-01-04 DIAGNOSIS — R5381 Other malaise: Secondary | ICD-10-CM | POA: Insufficient documentation

## 2014-01-04 DIAGNOSIS — F17200 Nicotine dependence, unspecified, uncomplicated: Secondary | ICD-10-CM | POA: Insufficient documentation

## 2014-01-04 DIAGNOSIS — R6883 Chills (without fever): Secondary | ICD-10-CM | POA: Insufficient documentation

## 2014-01-04 DIAGNOSIS — R5383 Other fatigue: Secondary | ICD-10-CM

## 2014-01-04 DIAGNOSIS — Z79899 Other long term (current) drug therapy: Secondary | ICD-10-CM | POA: Insufficient documentation

## 2014-01-04 DIAGNOSIS — Z8659 Personal history of other mental and behavioral disorders: Secondary | ICD-10-CM | POA: Insufficient documentation

## 2014-01-04 DIAGNOSIS — J069 Acute upper respiratory infection, unspecified: Secondary | ICD-10-CM | POA: Insufficient documentation

## 2014-01-04 MED ORDER — ALBUTEROL SULFATE HFA 108 (90 BASE) MCG/ACT IN AERS: 2.0000 | INHALATION_SPRAY | RESPIRATORY_TRACT | Status: DC | PRN

## 2014-01-04 MED ORDER — DEXAMETHASONE 4 MG PO TABS: ORAL_TABLET | ORAL | Status: DC

## 2014-01-04 NOTE — ED Notes (Signed)
Pt stats he was seen here yesterday and dx with URI. Pt states his body continues to ache, can't stop sneezing, and "breathing has gone down" NAD at this time.

## 2014-01-04 NOTE — ED Provider Notes (Signed)
Medical screening examination/treatment/procedure(s) were performed by non-physician practitioner and as supervising physician I was immediately available for consultation/collaboration.   EKG Interpretation None       Raeford RazorStephen Mclean Moya, MD 01/04/14 [phone]

## 2014-01-04 NOTE — ED Provider Notes (Signed)
CSN: [id number]     Arrival date & time 01/04/14  1555 History   First MD Initiated Contact with Patient 01/04/14 1735     Chief Complaint  Patient presents with  . URI     (Consider location/radiation/quality/duration/timing/severity/associated sxs/prior Treatment) HPI Comments: Patient is a 53 year old male who presents to the emergency department with a complaint of sneezing, coughing, and changes in breathing. The patient was seen here in the emergency department on yesterday March 8 with similar symptoms. He had a chest x-ray done which revealed some chronic changes, but no pneumonia or acute changes. He was treated, and released to go home. The patient states that since being at home his sneezing has become worse, states that he almost can stop sneezing. He's having more body aches, and he feels as though his breathing is more difficult. He attempted to go to work today, stated that his coworkers say that he did not look good and states that he felt bad most of the day at work.  Patient is a 53 y.o. male presenting with URI. The history is provided by the patient.  URI Presenting symptoms: congestion, cough and fatigue   Associated symptoms: no arthralgias, no neck pain and no wheezing     Past Medical History  Diagnosis Date  . Bipolar disorder    Past Surgical History  Procedure Laterality Date  . Appendectomy     No family history on file. History  Substance Use Topics  . Smoking status: Current Some Day Smoker -- 0.05 packs/day for 35 years    Types: Cigarettes  . Smokeless tobacco: Never Used  . Alcohol Use: No    Review of Systems  Constitutional: Positive for chills, activity change and fatigue.       All ROS Neg except as noted in HPI  HENT: Positive for congestion and sinus pressure. Negative for nosebleeds.   Eyes: Negative for photophobia and discharge.  Respiratory: Positive for cough. Negative for shortness of breath and wheezing.   Cardiovascular: Negative  for chest pain and palpitations.  Gastrointestinal: Negative for abdominal pain and blood in stool.  Genitourinary: Negative for dysuria, frequency and hematuria.  Musculoskeletal: Negative for arthralgias, back pain and neck pain.  Skin: Negative.   Neurological: Negative for dizziness, seizures and speech difficulty.  Psychiatric/Behavioral: Negative for hallucinations and confusion.      Allergies  Penicillins  Home Medications   Current Outpatient Rx  Name  Route  Sig  Dispense  Refill  . albuterol (PROVENTIL) (2.5 MG/3ML) 0.083% nebulizer solution   Nebulization   Take 3 mLs (2.5 mg total) by nebulization every 6 (six) hours as needed for wheezing or shortness of breath (cough).   75 mL   12   . guaiFENesin (ROBITUSSIN) 100 MG/5ML liquid   Oral   Take 5-10 mLs (100-200 mg total) by mouth every 4 (four) hours as needed for cough.   60 mL   0   . ibuprofen (ADVIL,MOTRIN) 600 MG tablet   Oral   Take 1 tablet (600 mg total) by mouth every 6 (six) hours as needed.   30 tablet   0   . OVER THE COUNTER MEDICATION   Oral   Take 3 tablets by mouth every 4 (four) hours as needed (pain). [employer] Brand Pain Reliever.          BP 127/81  Pulse 107  Temp(Src) 98.2 F (36.8 C) (Oral)  Resp 18  SpO2 98% Physical Exam  Nursing note and  vitals reviewed. Constitutional: He is oriented to person, place, and time. He appears well-developed and well-nourished.  Non-toxic appearance.  HENT:  Head: Normocephalic.  Right Ear: Tympanic membrane and external ear normal.  Left Ear: Tympanic membrane and external ear normal.  Nasal congestion present.  Airway is patent, uvula is in the midline.  Eyes: EOM and lids are normal. Pupils are equal, round, and reactive to light.  Neck: Normal range of motion. Neck supple. Carotid bruit is not present.  Cardiovascular: Regular rhythm, normal heart sounds, intact distal pulses and normal pulses.  Tachycardia present.    Pulmonary/Chest: Breath sounds normal. No respiratory distress.  Course breath sounds present. No wheezes, no decrease in breath sounds.  Abdominal: Soft. Bowel sounds are normal. There is no tenderness. There is no guarding.  Musculoskeletal: Normal range of motion.  Lymphadenopathy:       Head (right side): No submandibular adenopathy present.       Head (left side): No submandibular adenopathy present.    He has no cervical adenopathy.  Neurological: He is alert and oriented to person, place, and time. He has normal strength. No cranial nerve deficit or sensory deficit.  Skin: Skin is warm and dry. No rash noted.  Psychiatric: He has a normal mood and affect. His speech is normal.    ED Course  Procedures (including critical care time) Labs Review Labs Reviewed - No data to display Imaging Review Dg Chest 2 View  01/03/2014   CLINICAL DATA:  Cough and back pain.  EXAM: CHEST  2 VIEW  COMPARISON:  DG CHEST 2 VIEW dated 12/07/2012  FINDINGS: Two views of the chest demonstrate patchy densities in the lower lungs that appear to be chronic. There is no evidence for airspace disease or edema. Heart size is normal. The trachea is midline. No definite pleural effusions.  IMPRESSION: Chronic lung changes without acute findings.   Electronically Signed   By: Richarda Overlie M.D.   On: 01/03/2014 10:51     EKG Interpretation None      MDM Patient was seen in the emergency department on yesterday March 8 and noted to have upper respiratory infection. Today he returns for evaluation. His symptoms have worsened. No new or acute findings on examination at this time. The vital signs are well within normal limits with exception of the pulse rate being 107. The pulse oximetry is 98% on room air. Within normal limits by my interpretation.  I've instructed the patient that he is having continued symptoms of his upper respiratory infection. I have strongly encouraged patient to rest, to add Benadryl to his  current medications for assistance with the sneezing. He will receive a prescription for prednisone taper to go along with his current albuterol and Robitussin. The patient is encouraged to continue his ibuprofen every 6 hours over the next couple of days and then to use every 6 hours as needed. The patient and his significant other knowledge is understanding of these instructions. Questions have been answered. A work note has been given for the patient to return on Friday, March 13.    Final diagnoses:  None    *I have reviewed nursing notes, vital signs, and all appropriate lab and imaging results for this patient.Kathie Dike, PA-C 01/04/14 1757

## 2014-01-04 NOTE — Discharge Instructions (Signed)
Upper Respiratory Infection, Adult PLEASE INCREASE WATER, JUICES, AND GATORADE. PLEASE WASH HANDS FREQUENTLY. PLEASE USE YOUR MASK UNTIL SYMPTOMS HAVE RESOLVED. ADD BENADRYL TO YOUR CURRENT MEDICATIONS FOR SNEEZING. USE DECADRON 2 TIMES DAILY UNTIL ALL TAKEN. PLEASE CONTINUE YOUR CURRENT MEDICATIONS AS ORDERED.                               An upper respiratory infection (URI) is also sometimes known as the common cold. The upper respiratory tract includes the nose, sinuses, throat, trachea, and bronchi. Bronchi are the airways leading to the lungs. Most people improve within 1 week, but symptoms can last up to 2 weeks. A residual cough may last even longer.  CAUSES Many different viruses can infect the tissues lining the upper respiratory tract. The tissues become irritated and inflamed and often become very moist. Mucus production is also common. A cold is contagious. You can easily spread the virus to others by oral contact. This includes kissing, sharing a glass, coughing, or sneezing. Touching your mouth or nose and then touching a surface, which is then touched by another person, can also spread the virus. SYMPTOMS  Symptoms typically develop 1 to 3 days after you come in contact with a cold virus. Symptoms vary from person to person. They may include:  Runny nose.  Sneezing.  Nasal congestion.  Sinus irritation.  Sore throat.  Loss of voice (laryngitis).  Cough.  Fatigue.  Muscle aches.  Loss of appetite.  Headache.  Low-grade fever. DIAGNOSIS  You might diagnose your own cold based on familiar symptoms, since most people get a cold 2 to 3 times a year. Your caregiver can confirm this based on your exam. Most importantly, your caregiver can check that your symptoms are not due to another disease such as strep throat, sinusitis, pneumonia, asthma, or epiglottitis. Blood tests, throat tests, and X-rays are not necessary to diagnose a common cold, but they may sometimes be helpful  in excluding other more serious diseases. Your caregiver will decide if any further tests are required. RISKS AND COMPLICATIONS  You may be at risk for a more severe case of the common cold if you smoke cigarettes, have chronic heart disease (such as heart failure) or lung disease (such as asthma), or if you have a weakened immune system. The very young and very old are also at risk for more serious infections. Bacterial sinusitis, middle ear infections, and bacterial pneumonia can complicate the common cold. The common cold can worsen asthma and chronic obstructive pulmonary disease (COPD). Sometimes, these complications can require emergency medical care and may be life-threatening. PREVENTION  The best way to protect against getting a cold is to practice good hygiene. Avoid oral or hand contact with people with cold symptoms. Wash your hands often if contact occurs. There is no clear evidence that vitamin C, vitamin E, echinacea, or exercise reduces the chance of developing a cold. However, it is always recommended to get plenty of rest and practice good nutrition. TREATMENT  Treatment is directed at relieving symptoms. There is no cure. Antibiotics are not effective, because the infection is caused by a virus, not by bacteria. Treatment may include:  Increased fluid intake. Sports drinks offer valuable electrolytes, sugars, and fluids.  Breathing heated mist or steam (vaporizer or shower).  Eating chicken soup or other clear broths, and maintaining good nutrition.  Getting plenty of rest.  Using gargles or lozenges for comfort.  Controlling  fevers with ibuprofen or acetaminophen as directed by your caregiver.  Increasing usage of your inhaler if you have asthma. Zinc gel and zinc lozenges, taken in the first 24 hours of the common cold, can shorten the duration and lessen the severity of symptoms. Pain medicines may help with fever, muscle aches, and throat pain. A variety of  non-prescription medicines are available to treat congestion and runny nose. Your caregiver can make recommendations and may suggest nasal or lung inhalers for other symptoms.  HOME CARE INSTRUCTIONS   Only take over-the-counter or prescription medicines for pain, discomfort, or fever as directed by your caregiver.  Use a warm mist humidifier or inhale steam from a shower to increase air moisture. This may keep secretions moist and make it easier to breathe.  Drink enough water and fluids to keep your urine clear or pale yellow.  Rest as needed.  Return to work when your temperature has returned to normal or as your caregiver advises. You may need to stay home longer to avoid infecting others. You can also use a face mask and careful hand washing to prevent spread of the virus. SEEK MEDICAL CARE IF:   After the first few days, you feel you are getting worse rather than better.  You need your caregiver's advice about medicines to control symptoms.  You develop chills, worsening shortness of breath, or brown or red sputum. These may be signs of pneumonia.  You develop yellow or brown nasal discharge or pain in the face, especially when you bend forward. These may be signs of sinusitis.  You develop a fever, swollen neck glands, pain with swallowing, or white areas in the back of your throat. These may be signs of strep throat. SEEK IMMEDIATE MEDICAL CARE IF:   You have a fever.  You develop severe or persistent headache, ear pain, sinus pain, or chest pain.  You develop wheezing, a prolonged cough, cough up blood, or have a change in your usual mucus (if you have chronic lung disease).  You develop sore muscles or a stiff neck. Document Released: 04/10/2001 Document Revised: 01/07/2012 Document Reviewed: 02/16/2011 [facility] Patient Information 2014 [place], [place].

## 2014-01-04 NOTE — ED Notes (Signed)
Seen here yesterday for similar sx, URI,  Says he feels worse.frequent sneezing.

## 2014-10-11 DIAGNOSIS — Z720 Tobacco use: Secondary | ICD-10-CM | POA: Insufficient documentation

## 2015-07-04 DIAGNOSIS — I2699 Other pulmonary embolism without acute cor pulmonale: Secondary | ICD-10-CM | POA: Insufficient documentation

## 2016-02-09 DIAGNOSIS — R079 Chest pain, unspecified: Secondary | ICD-10-CM | POA: Insufficient documentation

## 2016-03-09 ENCOUNTER — Encounter: Payer: Self-pay | Admitting: Critical Care Medicine

## 2016-03-09 LAB — BASIC METABOLIC PANEL: GLUCOSE: 133 mg/dL

## 2016-03-09 NOTE — Progress Notes (Signed)
   Subjective:    Patient ID: Gary KeenLarry W Bracey, male    DOB: 10/02/1961, 55 y.o.   MRN: [id number]  HPI Chief Complaint  Patient presents with  . Hypertension   bp check  No issues     Review of Systems Constitutional:   No  weight loss, night sweats,  Fevers, chills, fatigue, lassitude. HEENT:   No headaches,  Difficulty swallowing,  Tooth/dental problems,  Sore throat,                No sneezing, itching, ear ache, nasal congestion, post nasal drip,   CV:  No chest pain,  Orthopnea, PND, swelling in lower extremities, anasarca, dizziness, palpitations  GI  No heartburn, indigestion, abdominal pain, nausea, vomiting, diarrhea, change in bowel habits, loss of appetite  Resp: No shortness of breath with exertion or at rest.  No excess mucus, no productive cough,  No non-productive cough,  No coughing up of blood.  No change in color of mucus.  No wheezing.  No chest wall deformity  Skin: no rash or lesions.  GU: no dysuria, change in color of urine, no urgency or frequency.  No flank pain.  MS:  No joint pain or swelling.  No decreased range of motion.  No back pain.  Psych:  No change in mood or affect. No depression or anxiety.  No memory loss.     Objective:   Physical Exam  Filed Vitals:   03/09/16 1132  BP: 142/94  Pulse: 69    Gen: Pleasant, well-nourished, in no distress,  normal affect  ENT: No lesions,  mouth clear,  oropharynx clear, no postnasal drip  Neck: No JVD, no TMG, no carotid bruits  Lungs: No use of accessory muscles, no dullness to percussion, clear without rales or rhonchi  Cardiovascular: RRR, heart sounds normal, no murmur or gallops, no peripheral edema  Abdomen: soft and NT, no HSM,  BS normal  Musculoskeletal: No deformities, no cyanosis or clubbing  Neuro: alert, non focal  Skin: Warm, no lesions or rashes  No results found.       Assessment & Plan:  HTN   Fair control  rec  Lisinopril 10/12.5  Daily #20 Then resume  metoprol 25mg  daily Keep f/u appts

## 2016-04-11 DIAGNOSIS — Z139 Encounter for screening, unspecified: Secondary | ICD-10-CM

## 2016-04-28 DIAGNOSIS — E119 Type 2 diabetes mellitus without complications: Secondary | ICD-10-CM

## 2016-04-28 HISTORY — DX: Type 2 diabetes mellitus without complications: E11.9

## 2016-05-30 ENCOUNTER — Encounter: Payer: Self-pay | Admitting: Physician Assistant

## 2016-05-30 ENCOUNTER — Ambulatory Visit: Payer: Self-pay | Admitting: Physician Assistant

## 2016-05-30 VITALS — BP 116/78 | HR 80 | Temp 97.9°F | Ht 71.0 in | Wt 293.0 lb

## 2016-05-30 DIAGNOSIS — Z86711 Personal history of pulmonary embolism: Secondary | ICD-10-CM | POA: Insufficient documentation

## 2016-05-30 DIAGNOSIS — Z13220 Encounter for screening for lipoid disorders: Secondary | ICD-10-CM

## 2016-05-30 DIAGNOSIS — Z125 Encounter for screening for malignant neoplasm of prostate: Secondary | ICD-10-CM

## 2016-05-30 DIAGNOSIS — Z7901 Long term (current) use of anticoagulants: Secondary | ICD-10-CM

## 2016-05-30 DIAGNOSIS — E119 Type 2 diabetes mellitus without complications: Secondary | ICD-10-CM

## 2016-05-30 DIAGNOSIS — J449 Chronic obstructive pulmonary disease, unspecified: Secondary | ICD-10-CM | POA: Insufficient documentation

## 2016-05-30 DIAGNOSIS — I10 Essential (primary) hypertension: Secondary | ICD-10-CM | POA: Insufficient documentation

## 2016-05-30 NOTE — Patient Instructions (Addendum)
Fasting labs Bring medications to every appointment

## 2016-05-30 NOTE — Progress Notes (Signed)
BP 116/78 (BP Location: Left Arm, Patient Position: Sitting, Cuff Size: Large)   Pulse 80   Temp 97.9 F (36.6 C)   Ht 5\' 11"  (1.803 m)   Wt 293 lb (132.9 kg)   SpO2 97%   BMI 40.87 kg/m    Subjective:    Patient ID: Gary Burke, male    DOB: 05-Apr-1961, 55 y.o.   MRN: [id number]  HPI: Gary Burke is a 55 y.o. male presenting on 05/30/2016 for New Patient (Initial Visit) (returning patient. pt was going to Dr. Sherril Croon in [facility], but pt stopped going due to cost and not being able to pay) and Hypertension (pt states he is taking HTN med, but is unsure of what it is. pt did not bring meds.)   HPI   Chief Complaint  Patient presents with  . New Patient (Initial Visit)    returning patient. pt was going to Dr. Sherril Croon in [facility], but pt stopped going due to cost and not being able to pay  . Hypertension    pt states he is taking HTN med, but is unsure of what it is. pt did not bring meds.    Pt was last seen here 03/11/13.  He says he moved to [place] for a while but is back now.  He doesn't know all his meds; he is taking Xaralto, glipizide and "25mg  something" for his bp.    Pt is working at [employer] and sweets in [place].  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  Pt states he had PE when he was living up in [place].  He was started on xaralto.  A pulmonary specialist up in [place] stopped his xaralto.  So when he moved back to [place], he had another PE.  He was treated at [facility]  Pt is currently getting xaralto at [facility] through P[facility] with Laural Benes and [place].    He is currently going to diabetes education classes with Dois Davenport.  He was dx with the diabetes about a month and a half ago.  His first medication started was glipizide.  He checks his bs at home sometimes- runs 113-130.    Pt had colonoscopy up in [place].   Pt states sob at times.  He has used inhalers and nebulizers in the past that have helped.  Relevant past medical, surgical, family and social history reviewed  and updated as indicated. Interim medical history since our last visit reviewed. Allergies and medications reviewed and updated.  Review of Systems  Constitutional: Positive for appetite change and unexpected weight change. Negative for chills, diaphoresis, fatigue and fever.  HENT: Negative for congestion, drooling, ear pain, facial swelling, hearing loss, mouth sores, sneezing, sore throat, trouble swallowing and voice change.   Eyes: Negative for pain, discharge, redness, itching and visual disturbance.  Respiratory: Positive for chest tightness, shortness of breath and wheezing. Negative for cough and choking.   Cardiovascular: Negative for chest pain, palpitations and leg swelling.  Gastrointestinal: Negative for abdominal pain, blood in stool, constipation, diarrhea and vomiting.  Endocrine: Negative for cold intolerance, heat intolerance and polydipsia.  Genitourinary: Negative for decreased urine volume, dysuria and hematuria.  Musculoskeletal: Negative for arthralgias, back pain and gait problem.  Skin: Negative for rash.  Allergic/Immunologic: Negative for environmental allergies.  Neurological: Positive for headaches. Negative for seizures, syncope and light-headedness.  Hematological: Negative for adenopathy.  Psychiatric/Behavioral: Negative for agitation, dysphoric mood and suicidal ideas. The patient is not nervous/anxious.  Per HPI unless specifically indicated above     Objective:    BP 116/78 (BP Location: Left Arm, Patient Position: Sitting, Cuff Size: Large)   Pulse 80   Temp 97.9 F (36.6 C)   Ht  (1.803 m)   Wt 293 lb (132.9 kg)   SpO2 97%   BMI 40.87 kg/m   Wt Readings from Last 3 Encounters:  05/30/16 293 lb (132.9 kg)  01/03/14 260 lb (117.9 kg)  06/05/13 268 lb (121.6 kg)    Physical Exam  Constitutional: He is oriented to person, place, and time. He appears well-developed and well-nourished.  HENT:  Head: Normocephalic and atraumatic.   Mouth/Throat: Oropharynx is clear and moist. No oropharyngeal exudate.  Eyes: Conjunctivae and EOM are normal. Pupils are equal, round, and reactive to light.  Neck: Neck supple. No thyromegaly present.  Cardiovascular: Normal rate and regular rhythm.   Pulmonary/Chest: Effort normal and breath sounds normal. He has no wheezes. He has no rales.  Abdominal: Soft. Bowel sounds are normal. He exhibits no mass. There is no hepatosplenomegaly. There is no tenderness.  Musculoskeletal: He exhibits no edema.  Lymphadenopathy:    He has no cervical adenopathy.  Neurological: He is alert and oriented to person, place, and time.  Skin: Skin is warm and dry. No rash noted.  Psychiatric: He has a normal mood and affect. His behavior is normal. Thought content normal.  Vitals reviewed.   Results for orders placed or performed in visit on 03/09/16  Basic metabolic panel  Result Value Ref Range   Glucose 133 mg/dL      Assessment & Plan:   Encounter Diagnoses  Name Primary?  . Controlled type 2 diabetes mellitus without complication, unspecified long term insulin use status (HCC) Yes  . Essential hypertension, benign   . History of pulmonary embolism   . Morbid obesity, unspecified obesity type (HCC)   . Chronic obstructive pulmonary disease, unspecified COPD type (HCC)   . Long term current use of anticoagulant therapy   . Screening for prostate cancer   . Screening cholesterol level     -Get baseline labs (pt repeatedly wanted to be taken out of work for an entire shift so he could get his blood drawn.  Discussed that he didn't necessarily need to have his blood drawn in the morning, he just needs to be fasting.  Recommended that he go when he awakens from sleep, whatever time that may be)  -Record request sent to [facility] for colonoscopy report  -Pt counseled to bring all meds to every appointment. No changes to meds today except addition of MDI  -will get pt signed up for  med[facility]ist and order albuterol mdi  -f/u one month.  RTO sooner prn

## 2016-05-31 LAB — CBC WITH DIFFERENTIAL/PLATELET
Basophils Absolute: 0 cells/uL (ref 0–200)
Basophils Relative: 0 %
EOS PCT: 2 %
Eosinophils Absolute: 212 cells/uL (ref 15–500)
HCT: 46.5 % (ref 38.5–50.0)
HEMOGLOBIN: 15.8 g/dL (ref 13.2–17.1)
LYMPHS ABS: 2756 {cells}/uL (ref 850–3900)
Lymphocytes Relative: 26 %
MCH: 30.3 pg (ref 27.0–33.0)
MCHC: 34 g/dL (ref 32.0–36.0)
MCV: 89.3 fL (ref 80.0–100.0)
MPV: 11 fL (ref 7.5–12.5)
Monocytes Absolute: 848 cells/uL (ref 200–950)
Monocytes Relative: 8 %
NEUTROS ABS: 6784 {cells}/uL (ref 1500–7800)
NEUTROS PCT: 64 %
PLATELETS: 214 10*3/uL (ref 140–400)
RBC: 5.21 MIL/uL (ref 4.20–5.80)
RDW: 14.2 % (ref 11.0–15.0)
WBC: 10.6 10*3/uL (ref 3.8–10.8)

## 2016-05-31 LAB — COMPLETE METABOLIC PANEL WITH GFR
ALBUMIN: 4.4 g/dL (ref 3.6–5.1)
ALK PHOS: 51 U/L (ref 40–115)
ALT: 31 U/L (ref 9–46)
AST: 32 U/L (ref 10–35)
BUN: 15 mg/dL (ref 7–25)
CALCIUM: 9.2 mg/dL (ref 8.6–10.3)
CO2: 25 mmol/L (ref 20–31)
Chloride: 104 mmol/L (ref 98–110)
Creat: 1.17 mg/dL (ref 0.70–1.33)
GFR, EST AFRICAN AMERICAN: 80 mL/min (ref >=60)
GFR, EST NON AFRICAN AMERICAN: 69 mL/min (ref >=60)
Glucose, Bld: 102 mg/dL — ABNORMAL HIGH (ref 65–99)
POTASSIUM: 4.3 mmol/L (ref 3.5–5.3)
Sodium: 140 mmol/L (ref 135–146)
Total Bilirubin: 1.6 mg/dL — ABNORMAL HIGH (ref 0.2–1.2)
Total Protein: 7.2 g/dL (ref 6.1–8.1)

## 2016-05-31 LAB — LIPID PANEL
CHOL/HDL RATIO: 5.6 ratio — AB (ref <=5.0)
Cholesterol: 146 mg/dL (ref 125–200)
HDL: 26 mg/dL — AB (ref >=40)
LDL CALC: 92 mg/dL (ref <130)
Triglycerides: 139 mg/dL (ref <150)
VLDL: 28 mg/dL (ref <30)

## 2016-06-01 LAB — MICROALBUMIN, URINE: Microalb, Ur: 1.1 mg/dL

## 2016-06-01 LAB — PSA: PSA: 0.39 ng/mL (ref <=4.00)

## 2016-06-01 LAB — HEMOGLOBIN A1C
HEMOGLOBIN A1C: 6.7 % — AB (ref <5.7)
Mean Plasma Glucose: 146 mg/dL

## 2016-06-16 ENCOUNTER — Encounter (HOSPITAL_COMMUNITY): Payer: Self-pay | Admitting: *Deleted

## 2016-06-16 ENCOUNTER — Emergency Department (HOSPITAL_COMMUNITY)
Admission: EM | Admit: 2016-06-16 | Discharge: 2016-06-17 | Disposition: A | Discharge status: Home / Self Care | Payer: Self-pay | Attending: Emergency Medicine | Admitting: Emergency Medicine

## 2016-06-16 ENCOUNTER — Emergency Department (HOSPITAL_COMMUNITY): Payer: Self-pay

## 2016-06-16 DIAGNOSIS — E119 Type 2 diabetes mellitus without complications: Secondary | ICD-10-CM | POA: Insufficient documentation

## 2016-06-16 DIAGNOSIS — Z7982 Long term (current) use of aspirin: Secondary | ICD-10-CM | POA: Insufficient documentation

## 2016-06-16 DIAGNOSIS — R0602 Shortness of breath: Secondary | ICD-10-CM | POA: Insufficient documentation

## 2016-06-16 DIAGNOSIS — I10 Essential (primary) hypertension: Secondary | ICD-10-CM | POA: Insufficient documentation

## 2016-06-16 DIAGNOSIS — R079 Chest pain, unspecified: Secondary | ICD-10-CM | POA: Insufficient documentation

## 2016-06-16 DIAGNOSIS — Z87891 Personal history of nicotine dependence: Secondary | ICD-10-CM | POA: Insufficient documentation

## 2016-06-16 DIAGNOSIS — Z794 Long term (current) use of insulin: Secondary | ICD-10-CM | POA: Insufficient documentation

## 2016-06-16 DIAGNOSIS — R911 Solitary pulmonary nodule: Secondary | ICD-10-CM | POA: Insufficient documentation

## 2016-06-16 LAB — CBC WITH DIFFERENTIAL/PLATELET
BASOS ABS: 0 10*3/uL (ref 0.0–0.1)
Basophils Relative: 0 %
Eosinophils Absolute: 0.3 10*3/uL (ref 0.0–0.7)
Eosinophils Relative: 3 %
HEMATOCRIT: 44.4 % (ref 39.0–52.0)
Hemoglobin: 15.2 g/dL (ref 13.0–17.0)
LYMPHS ABS: 2.8 10*3/uL (ref 0.7–4.0)
LYMPHS PCT: 27 %
MCH: 30.3 pg (ref 26.0–34.0)
MCHC: 34.2 g/dL (ref 30.0–36.0)
MCV: 88.4 fL (ref 78.0–100.0)
MONO ABS: 0.6 10*3/uL (ref 0.1–1.0)
MONOS PCT: 6 %
NEUTROS ABS: 6.6 10*3/uL (ref 1.7–7.7)
Neutrophils Relative %: 64 %
Platelets: 207 10*3/uL (ref 150–400)
RBC: 5.02 MIL/uL (ref 4.22–5.81)
RDW: 13.6 % (ref 11.5–15.5)
WBC: 10.4 10*3/uL (ref 4.0–10.5)

## 2016-06-16 LAB — BASIC METABOLIC PANEL
Anion gap: 5 (ref 5–15)
BUN: 18 mg/dL (ref 6–20)
CHLORIDE: 109 mmol/L (ref 101–111)
CO2: 24 mmol/L (ref 22–32)
Calcium: 8.3 mg/dL — ABNORMAL LOW (ref 8.9–10.3)
Creatinine, Ser: 1.21 mg/dL (ref 0.61–1.24)
GFR calc Af Amer: >60 mL/min (ref >60)
GFR calc non Af Amer: >60 mL/min (ref >60)
GLUCOSE: 164 mg/dL — AB (ref 65–99)
POTASSIUM: 3.7 mmol/L (ref 3.5–5.1)
Sodium: 138 mmol/L (ref 135–145)

## 2016-06-16 LAB — TROPONIN I
Troponin I: <0.03 ng/mL (ref <0.03)
Troponin I: <0.03 ng/mL (ref <0.03)

## 2016-06-16 MED ORDER — IOPAMIDOL (ISOVUE-370) INJECTION 76%
100.0000 mL | Freq: Once | INTRAVENOUS | Status: AC | PRN
  Administered 2016-06-16: 100 mL via INTRAVENOUS

## 2016-06-16 MED ORDER — ALBUTEROL SULFATE (2.5 MG/3ML) 0.083% IN NEBU
5.0000 mg | INHALATION_SOLUTION | Freq: Once | RESPIRATORY_TRACT | Status: AC
  Administered 2016-06-16: 5 mg via RESPIRATORY_TRACT
  Filled 2016-06-16: qty 6

## 2016-06-16 NOTE — ED Triage Notes (Signed)
Pt reports sob since earlier today. Pt also reports pain in his left lung. Pt reports having a PE earlier this year.

## 2016-06-16 NOTE — ED Notes (Signed)
Patient returned from radiology

## 2016-06-16 NOTE — ED Notes (Signed)
Patient resting in bed at this time, family at bedside. No needs voiced. 

## 2016-06-16 NOTE — Discharge Instructions (Signed)
You need a repeat CT scan in 12-18 months. Your primary care doctor can arrange this.

## 2016-06-16 NOTE — ED Provider Notes (Signed)
[facility]Y DEPT Provider Note   CSN: [id number] Arrival date & time: 06/16/16  1901     History   Chief Complaint Chief Complaint  Patient presents with  . Shortness of Breath    HPI Gary Burke is a 55 y.o. male.Presenting with dyspnea. Patient states that around 3 PM Gary Burke develop shortness of breath. Gary Burke took a nap and when Gary Burke woke up Gary Burke still has shortness of breath. Has not noticed much change with walking around. No coughing or fevers. States Gary Burke has slight chest pain on the left side that was present earlier but now currently gone. 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 Most recently occurred in January. [place] these [place]. First one was September 2016, no obvious cause. Gary Burke was put on Xarelto and then taken off in November. Then had another episode of PE on the left side and was put back on Xarelto and has been on it ever since. No missed doses. This feels somewhat similar to prior PE.  HPI  Past Medical History:  Diagnosis Date  . Bipolar disorder (HCC)   . Diabetes mellitus without complication (HCC) 04/2016  . Hypertension   . Pulmonary embolism (HCC)    L lung    Patient Active Problem List   Diagnosis Date Noted  . Essential hypertension, benign 05/30/2016  . History of pulmonary embolism 05/30/2016  . Morbid obesity (HCC) 05/30/2016  . Chronic obstructive pulmonary disease (HCC) 05/30/2016  . Long term current use of anticoagulant therapy 05/30/2016    Past Surgical History:  Procedure Laterality Date  . APPENDECTOMY  age 55       Home Medications    Prior to Admission medications   Medication Sig Start Date End Date Taking? Authorizing Provider  albuterol (PROVENTIL HFA;VENTOLIN HFA) 108 (90 Base) MCG/ACT inhaler Inhale 1-2 puffs into the lungs every 6 (six) hours as needed for wheezing or shortness of breath.   Yes Historical Provider, MD  albuterol (PROVENTIL) (2.5 MG/3ML) 0.083% nebulizer solution Take 2.5 mg by nebulization every 6 (six)  hours as needed for wheezing or shortness of breath.   Yes Historical Provider, MD  aspirin 81 MG tablet Take 81 mg by mouth daily.   Yes Historical Provider, MD  glipiZIDE (GLUCOTROL) 5 MG tablet Take 5 mg by mouth daily before breakfast.   Yes Historical Provider, MD  metoprolol tartrate (LOPRESSOR) 25 MG tablet Take 25 mg by mouth daily.   Yes Historical Provider, MD  rivaroxaban (XARELTO) 20 MG TABS tablet Take 20 mg by mouth daily with supper.   Yes Historical Provider, MD    Family History Family History  Problem Relation Age of Onset  . Diabetes Daughter     Social History Social History  Substance Use Topics  . Smoking status: Former Smoker    Packs/day: 0.50    Years: 37.00    Types: Cigarettes    Quit date: 10/29/2014  . Smokeless tobacco: Never Used  . Alcohol use No     Allergies   Penicillins   Review of Systems Review of Systems  Constitutional: Negative for fever.  HENT: Negative for congestion.   Respiratory: Positive for shortness of breath. Negative for cough.   Cardiovascular: Positive for chest pain. Negative for leg swelling.  Gastrointestinal: Negative for abdominal pain.  All other systems reviewed and are negative.    Physical Exam Updated Vital Signs BP 109/83 (BP Location: Left Arm)   Pulse 87   Temp 97.8 F (36.6  C) (Oral)   Resp 16   Ht 6' (1.829 m)   Wt 292 lb (132.5 kg)   SpO2 93%   BMI 39.60 kg/m   Physical Exam  Constitutional: Gary Burke is oriented to person, place, and time. Gary Burke appears well-developed and well-nourished. No distress.  HENT:  Head: Normocephalic and atraumatic.  Right Ear: External ear normal.  Left Ear: External ear normal.  Nose: Nose normal.  Eyes: Right eye exhibits no discharge. Left eye exhibits no discharge.  Neck: Neck supple.  Cardiovascular: Normal rate, regular rhythm, normal heart sounds and intact distal pulses.   Pulses:      Radial pulses are 2+ on the right side, and 2+ on the left side.    Pulmonary/Chest: Effort normal and breath sounds normal. No tachypnea. No respiratory distress. Gary Burke has no wheezes. Gary Burke has no rales. Gary Burke exhibits no tenderness.  Abdominal: Soft. There is no tenderness.  Musculoskeletal: Gary Burke exhibits no edema.  Neurological: Gary Burke is alert and oriented to person, place, and time.  Skin: Skin is warm and dry. Gary Burke is not diaphoretic.  Nursing note and vitals reviewed.    ED Treatments / Results  Labs (all labs ordered are listed, but only abnormal results are displayed) Labs Reviewed  BASIC METABOLIC PANEL - Abnormal; Notable for the following:       Result Value   Glucose, Bld 164 (*)    Calcium 8.3 (*)    All other components within normal limits  TROPONIN I  CBC WITH DIFFERENTIAL/PLATELET  TROPONIN I    EKG  EKG Interpretation  Date/Time:  Saturday June 16 2016 19:17:09 EDT Ventricular Rate:  91 PR Interval:    QRS Duration: 100 QT Interval:  351 QTC Calculation: 432 R Axis:   60 Text Interpretation:  Sinus rhythm no significant change since 2012 Confirmed by Sela Falk MD, Miriya Cloer [phone]) on 06/16/2016 7:49:29 PM       Radiology Dg Chest 2 View  Result Date: 06/16/2016 CLINICAL DATA:  Shortness of breath and left-sided chest pain, initial encounter EXAM: CHEST  2 VIEW COMPARISON:  05/16/2016 FINDINGS: Cardiac shadow is stable. Mild interstitial changes are again seen without focal infiltrate. No effusion or pneumothorax is noted. Mild degenerative change of the thoracic spine is seen. IMPRESSION: No active cardiopulmonary disease. Electronically Signed   By: Alcide CleverMark  Lukens M.D.   On: 06/16/2016 20:06   Ct Angio Chest Pe W And/or Wo Contrast  Result Date: 06/16/2016 CLINICAL DATA:  Shortness of breath and left-sided chest pain. Previous pulmonary embolism. EXAM: CT ANGIOGRAPHY CHEST WITH CONTRAST TECHNIQUE: Multidetector CT imaging of the chest was performed using the standard protocol during bolus administration of intravenous contrast.  Multiplanar CT image reconstructions and MIPs were obtained to evaluate the vascular anatomy. CONTRAST:  185 mL Isovue 370 over 2 injections. COMPARISON:  Chest radiograph 06/16/2016 FINDINGS: Vascular: There is no pulmonary embolus. The main pulmonary artery is mildly enlarged, measuring 3.5 cm at the bifurcation. The visualized aorta and arch vessels are normal. Pulmonary veins are patent. Mediastinum: No pericardial effusion or cardiomegaly. No mediastinal adenopathy. Lungs/Pleura: There is a 5 mm right middle lobe pulmonary nodule (series 13, image 61), unchanged from 11/21/2015 calcified granulomata within the left lower lobe (series 13, image 58) is also unchanged. 9 mm left lower lobe pulmonary nodule (series 13, image 61) is unchanged. No pleural effusion or focal consolidation. Visualized Abdomen: Contrast bolus timing is not optimized for evaluation of the abdominal organs. Within that limitation, the appearance of the liver  and spleen is normal. Musculoskeletal: Multilevel thoracic osteophytosis. No lytic or blastic osseous lesions. Review of the MIP images confirms the above findings. IMPRESSION: 1. No pulmonary embolus. 2. Mild enlargement of the main pulmonary artery, which may be seen in the setting of pulmonary hypertension. 3. **An incidental finding of potential clinical significance has been found. Multiple pulmonary nodules, measuring up to 9 mm, which have been stable for least 6 months. Recommend next follow-up chest CT in 12-18 months (from today's scan) is considered optional for low-risk patients, but is recommended for high-risk patients. This recommendation follows the consensus statement: Guidelines for Management of Incidental Pulmonary Nodules Detected on CT Images:From the Fleischner Society 2017; published online before print (10.1148/radiol.1610960454). ** Electronically Signed   By: Deatra Robinson M.D.   On: 06/16/2016 22:18    Procedures Procedures (including critical care  time)  Medications Ordered in ED Medications  albuterol (PROVENTIL) (2.5 MG/3ML) 0.083% nebulizer solution 5 mg (5 mg Nebulization Given 06/16/16 1951)  iopamidol (ISOVUE-370) 76 % injection 100 mL (100 mLs Intravenous Contrast Given 06/16/16 2142)     Initial Impression / Assessment and Plan / ED Course  I have reviewed the triage vital signs and the nursing notes.  Pertinent labs & imaging results that were available during my care of the patient were reviewed by me and considered in my medical decision making (see chart for details).  Clinical Course  Comment By Time  No obvious cause for dyspnea based on exam. Doubt ACS.Will need CT to r/o PE given his history. No current bronchospasm. Pricilla Loveless, MD 08/19 2001  CT scan shows no PE. Blood work unremarkable including normal hemoglobin, renal function, and initial troponin. ECG was unremarkable. Gary Burke states at this point Gary Burke feels like Gary Burke has no shortness of breath at rest. His oxygen saturation is above 93%. Gary Burke continues to have no cough. Having with no obvious cause, second troponin is reasonable but I do not think Gary Burke needs to be admitted for monitoring overnight. Plan to have him follow-up with his PCP assuming second troponin is negative. Discussed lung nodules, and need for CT scan and 12-18 months. Pricilla Loveless, MD 08/19 2227    Unclear why Gary Burke had dyspnea but it is completely resolved. Will get 2nd troponin, if negative f/u with PCP. Discussed return precautions. Dr. Rosalia Hammers to follow up on 2nd troponin.  Final Clinical Impressions(s) / ED Diagnoses   Final diagnoses:  SOB (shortness of breath)  Pulmonary nodule    New Prescriptions New Prescriptions   No medications on file     Pricilla Loveless, MD 06/16/16 2238

## 2016-06-16 NOTE — ED Notes (Signed)
Patient to Radiology

## 2016-06-16 NOTE — ED Notes (Signed)
Patient c/o shortness of breath that started this afternoon. Patient states history of PE this year and is currently taking blood thinner

## 2016-06-28 NOTE — Congregational Nurse Program (Signed)
Congregational Nurse Program Note  Date of Encounter: 04/11/2016  Past Medical History: Past Medical History:  Diagnosis Date  . Bipolar disorder (HCC)   . Diabetes mellitus without complication (HCC) 04/2016  . Hypertension   . Pulmonary embolism (HCC)    L lung    Encounter Details:  Client was offered support with newly diagnoses of type II diabetes, also has hypertension. 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 Client was given information about Diabetes classes offered through [facility]. Vital signs today BP 112/90 P- 79. Mechele DawleyJan Jone, RN CNP [phone]

## 2016-07-03 ENCOUNTER — Ambulatory Visit: Payer: Self-pay | Admitting: Physician Assistant

## 2016-07-05 ENCOUNTER — Other Ambulatory Visit: Payer: Self-pay | Admitting: Physician Assistant

## 2016-07-05 ENCOUNTER — Ambulatory Visit: Payer: Self-pay | Admitting: Physician Assistant

## 2016-07-05 ENCOUNTER — Encounter: Payer: Self-pay | Admitting: Physician Assistant

## 2016-07-05 VITALS — BP 124/86 | HR 92 | Temp 97.5°F | Ht 71.0 in | Wt 296.2 lb

## 2016-07-05 DIAGNOSIS — E119 Type 2 diabetes mellitus without complications: Secondary | ICD-10-CM

## 2016-07-05 DIAGNOSIS — I10 Essential (primary) hypertension: Secondary | ICD-10-CM

## 2016-07-05 DIAGNOSIS — J449 Chronic obstructive pulmonary disease, unspecified: Secondary | ICD-10-CM

## 2016-07-05 DIAGNOSIS — R9389 Abnormal findings on diagnostic imaging of other specified body structures: Secondary | ICD-10-CM | POA: Insufficient documentation

## 2016-07-05 DIAGNOSIS — Z86711 Personal history of pulmonary embolism: Secondary | ICD-10-CM

## 2016-07-05 DIAGNOSIS — Z7901 Long term (current) use of anticoagulants: Secondary | ICD-10-CM

## 2016-07-05 DIAGNOSIS — Z86010 Personal history of colonic polyps: Secondary | ICD-10-CM

## 2016-07-05 NOTE — Progress Notes (Signed)
BP 124/86 (BP Location: Left Arm, Patient Position: Sitting, Cuff Size: Large)   Pulse 92   Temp 97.5 F (36.4 C) (Other (Comment))   Ht 5\' 11"  (1.803 m)   Wt 296 lb 3.2 oz (134.4 kg)   SpO2 97%   BMI 41.31 kg/m    Subjective:    Patient ID: Gary Burke, male    DOB: 1961/06/10, 55 y.o.   MRN: [id number]  HPI: Gary Burke is a 54 y.o. male presenting on 07/05/2016 for Diabetes; COPD; and Hypertension   HPI   Pt did not bring meds again today.   colonoscopy report from [facility] [place] received- done 06/15/15- had 3 polyps- recommended f/u 3 years based on pathology results  Pt went to ER for sob in august. Records reviewed including multiple nodules on CT with recommendations to repeat in 12 months  Pt states breathing okay since going to the ER  Relevant past medical, surgical, family and social history reviewed and updated as indicated. Interim medical history since our last visit reviewed. Allergies and medications reviewed and updated.   Current Outpatient Prescriptions:  .  albuterol (PROVENTIL HFA;VENTOLIN HFA) 108 (90 Base) MCG/ACT inhaler, Inhale 1-2 puffs into the lungs every 6 (six) hours as needed for wheezing or shortness of breath., Disp: , Rfl:  .  albuterol (PROVENTIL) (2.5 MG/3ML) 0.083% nebulizer solution, Take 2.5 mg by nebulization every 6 (six) hours as needed for wheezing or shortness of breath., Disp: , Rfl:  .  aspirin 81 MG tablet, Take 81 mg by mouth daily., Disp: , Rfl:  .  glipiZIDE (GLUCOTROL) 5 MG tablet, Take 5 mg by mouth daily before breakfast., Disp: , Rfl:  .  metoprolol tartrate (LOPRESSOR) 25 MG tablet, Take 25 mg by mouth daily., Disp: , Rfl:  .  rivaroxaban (XARELTO) 20 MG TABS tablet, Take 20 mg by mouth daily with supper., Disp: , Rfl:    Review of Systems  Constitutional: Negative for appetite change, chills, diaphoresis, fatigue, fever and unexpected weight change.  HENT: Negative for congestion, dental problem, drooling, ear pain, facial  swelling, hearing loss, mouth sores, sneezing, sore throat, trouble swallowing and voice change.   Eyes: Negative for pain, discharge, redness, itching and visual disturbance.  Respiratory: Negative for cough, choking, shortness of breath and wheezing.   Cardiovascular: Negative for chest pain, palpitations and leg swelling.  Gastrointestinal: Negative for abdominal pain, blood in stool, constipation, diarrhea and vomiting.  Endocrine: Negative for cold intolerance, heat intolerance and polydipsia.  Genitourinary: Negative for decreased urine volume, dysuria and hematuria.  Musculoskeletal: Negative for arthralgias, back pain and gait problem.  Skin: Negative for rash.  Allergic/Immunologic: Negative for environmental allergies.  Neurological: Negative for seizures, syncope, light-headedness and headaches.  Hematological: Negative for adenopathy.  Psychiatric/Behavioral: Negative for agitation, dysphoric mood and suicidal ideas. The patient is not nervous/anxious.     Per HPI unless specifically indicated above     Objective:    BP 124/86 (BP Location: Left Arm, Patient Position: Sitting, Cuff Size: Large)   Pulse 92   Temp 97.5 F (36.4 C) (Other (Comment))   Ht 5\' 11"  (1.803 m)   Wt 296 lb 3.2 oz (134.4 kg)   SpO2 97%   BMI 41.31 kg/m   Wt Readings from Last 3 Encounters:  07/05/16 296 lb 3.2 oz (134.4 kg)  06/16/16 292 lb (132.5 kg)  05/30/16 293 lb (132.9 kg)    Physical Exam  Constitutional: He is oriented to person, place, and  time. He appears well-developed and well-nourished.  HENT:  Head: Normocephalic and atraumatic.  Neck: Neck supple.  Cardiovascular: Normal rate and regular rhythm.   Pulmonary/Chest: Effort normal and breath sounds normal. He has no wheezes.  Abdominal: Soft. Bowel sounds are normal. There is no hepatosplenomegaly. There is no tenderness.  Musculoskeletal: He exhibits no edema.  Lymphadenopathy:    He has no cervical adenopathy.   Neurological: He is alert and oriented to person, place, and time.  Skin: Skin is warm and dry.  Psychiatric: He has a normal mood and affect. His behavior is normal.  Vitals reviewed.       Assessment & Plan:   Encounter Diagnoses  Name Primary?  . Controlled type 2 diabetes mellitus without complication, unspecified long term insulin use status (HCC) Yes  . Chronic obstructive pulmonary disease, unspecified COPD type (HCC)   . Essential hypertension, benign   . History of colonic polyps   . Abnormal chest CT   . Long term current use of anticoagulant therapy   . Morbid obesity, unspecified obesity type (HCC)   . History of pulmonary embolism     -reviewed labs with pt -reviewed colonoscopy report from 2016 with pt -reviewed CT chest from last month with pt -pt is continuing with dm education class in [facility] -Spent time discussing weight management and he wanted to know what weight he should be.  Discussed and he will start with goal of 20 pound loss which should help his medical condiions a lot -F/u with me 3 months -F/u with nurse 1 month for medication reconciliation -Continue current meds -RTO sooner prn (spent 30 minutes with pt over half of which was with counseling and education)

## 2016-07-19 ENCOUNTER — Emergency Department (HOSPITAL_COMMUNITY)
Admission: EM | Admit: 2016-07-19 | Discharge: 2016-07-20 | Disposition: A | Discharge status: Home / Self Care | Payer: Self-pay | Attending: Emergency Medicine | Admitting: Emergency Medicine

## 2016-07-19 ENCOUNTER — Encounter (HOSPITAL_COMMUNITY): Payer: Self-pay | Admitting: *Deleted

## 2016-07-19 ENCOUNTER — Emergency Department (HOSPITAL_COMMUNITY): Payer: Self-pay

## 2016-07-19 DIAGNOSIS — Z7984 Long term (current) use of oral hypoglycemic drugs: Secondary | ICD-10-CM | POA: Insufficient documentation

## 2016-07-19 DIAGNOSIS — E119 Type 2 diabetes mellitus without complications: Secondary | ICD-10-CM | POA: Insufficient documentation

## 2016-07-19 DIAGNOSIS — Z87891 Personal history of nicotine dependence: Secondary | ICD-10-CM | POA: Insufficient documentation

## 2016-07-19 DIAGNOSIS — Z79899 Other long term (current) drug therapy: Secondary | ICD-10-CM | POA: Insufficient documentation

## 2016-07-19 DIAGNOSIS — I10 Essential (primary) hypertension: Secondary | ICD-10-CM | POA: Insufficient documentation

## 2016-07-19 DIAGNOSIS — M1711 Unilateral primary osteoarthritis, right knee: Secondary | ICD-10-CM | POA: Insufficient documentation

## 2016-07-19 DIAGNOSIS — Z7982 Long term (current) use of aspirin: Secondary | ICD-10-CM | POA: Insufficient documentation

## 2016-07-19 NOTE — ED Triage Notes (Addendum)
Pt c/o sharp pain located behind right knee that started a few days ago, pt states that he has recently been diagnosed with PE,

## 2016-07-20 MED ORDER — TRAMADOL HCL 50 MG PO TABS: 50.0000 mg | ORAL_TABLET | Freq: Four times a day (QID) | ORAL | 0 refills | Status: DC | PRN

## 2016-07-20 NOTE — Discharge Instructions (Signed)
You do have mild arthritis in your right knee.  You may try a heating pad 20 minutes several time daily when your knee is particularly achy.  Tylenol arthritis strength is considered the first line medicine for arthritis pain.  You may add the prescription also if needed for increased pain, but this is a narcotic - use caution as this medicine can make you sleepy and you should not drive within 4 hours of taking this medicine.

## 2016-07-20 NOTE — ED Notes (Signed)
Given patient something to drink.

## 2016-07-20 NOTE — ED Notes (Signed)
Patient walked to the bathroom with minimal assistance.  

## 2016-07-21 NOTE — ED Provider Notes (Signed)
[facility] DE[facility] Provider Note   CSN: [id number] Arrival date & time: 07/19/16  2113     History   Chief Complaint Chief Complaint  Patient presents with  . Knee Pain    HPI Gary Burke is a 55 y.o. male presenting with right posterior knee which is sharp and worsened with movement and certain positions, stared several days ago.  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 He was just told his job may switch from 8 hours to 12 hours and he is concerned about his knee being able to tolerate such a long shift. He does have a history of PE last diagnosed in January and is currently on Xarelto for this condition.  He denies lower extremity edema or pain except his right knee per above. He is a diabetic - he keeps his cbg's within the 120-160 range.  The history is provided by the patient.    Past Medical History:  Diagnosis Date  . Bipolar disorder (HCC)   . Diabetes mellitus without complication (HCC) 04/2016  . Hypertension   . Pulmonary embolism (HCC)    L lung    Patient Active Problem List   Diagnosis Date Noted  . History of colonic polyps 07/05/2016  . Abnormal chest CT 07/05/2016  . Controlled type 2 diabetes mellitus without complication (HCC) 07/05/2016  . Essential hypertension, benign 05/30/2016  . History of pulmonary embolism 05/30/2016  . Morbid obesity (HCC) 05/30/2016  . Chronic obstructive pulmonary disease (HCC) 05/30/2016  . Long term current use of anticoagulant therapy 05/30/2016    Past Surgical History:  Procedure Laterality Date  . APPENDECTOMY  age 43       Home Medications    Prior to Admission medications   Medication Sig Start Date End Date Taking? Authorizing Provider  albuterol (PROVENTIL HFA;VENTOLIN HFA) 108 (90 Base) MCG/ACT inhaler Inhale 1-2 puffs into the lungs every 6 (six) hours as needed for wheezing or shortness of breath.   Yes Historical  Provider, MD  albuterol (PROVENTIL) (2.5 MG/3ML) 0.083% nebulizer solution Take 2.5 mg by nebulization every 6 (six) hours as needed for wheezing or shortness of breath.   Yes Historical Provider, MD  aspirin 81 MG tablet Take 81 mg by mouth daily.   Yes Historical Provider, MD  glipiZIDE (GLUCOTROL) 5 MG tablet Take 5 mg by mouth daily before breakfast.   Yes Historical Provider, MD  metoprolol tartrate (LOPRESSOR) 25 MG tablet Take 25 mg by mouth daily.   Yes Historical Provider, MD  rivaroxaban (XARELTO) 20 MG TABS tablet Take 20 mg by mouth daily with supper.   Yes Historical Provider, MD  traMADol (ULTRAM) 50 MG tablet Take 1 tablet (50 mg total) by mouth every 6 (six) hours as needed. 07/20/16   Burgess Amor, PA-C    Family History Family History  Problem Relation Age of Onset  . Diabetes Daughter     Social History Social History  Substance Use Topics  . Smoking status: Former Smoker    Packs/day: 0.50    Years: 37.00    Types: Cigarettes    Quit date: 10/29/2014  . Smokeless tobacco: Never Used  . Alcohol use No     Allergies   Penicillins   Review of Systems Review of Systems  Constitutional: Negative for chills and fever.  Respiratory: Negative for chest tightness and shortness of breath.   Musculoskeletal: Positive for arthralgias  and joint swelling. Negative for myalgias.  Skin: Negative for color change.  Neurological: Negative for weakness and numbness.     Physical Exam Updated Vital Signs BP 116/86 (BP Location: Left Arm)   Pulse 77   Temp 97.8 F (36.6 C) (Oral)   Resp 16   Ht 5\' 8"  (1.727 m)   Wt 132.5 kg   SpO2 95%   BMI 44.40 kg/m   Physical Exam  Constitutional: He appears well-developed and well-nourished.  HENT:  Head: Atraumatic.  Neck: Normal range of motion.  Cardiovascular:  Pulses:      Popliteal pulses are 2+ on the right side, and 2+ on the left side.  Pulses equal bilaterally.   Anterior tibial varicosities present.  Right  superficial saphenous vein also varicosed, no edema, erythema, induration or pain along the course of these soft vessels.  Musculoskeletal: He exhibits tenderness.       Right knee: He exhibits swelling. He exhibits normal range of motion, no ecchymosis, no erythema, no LCL laxity, normal patellar mobility, no bony tenderness and no MCL laxity.  Subtle increased swelling right popliteal space in comparison to left which is soft, nontender.  No induration.  No erythema. No distal edema.  FROM of knee joint.  Neurological: He is alert. He has normal strength. He displays normal reflexes. No sensory deficit.  Skin: Skin is warm and dry.  Psychiatric: He has a normal mood and affect.     ED Treatments / Results  Labs (all labs ordered are listed, but only abnormal results are displayed) Labs Reviewed - No data to display  EKG  EKG Interpretation None       Radiology Dg Knee Complete 4 Views Right  Result Date: 07/20/2016 CLINICAL DATA:  Pt c/o pain in Rt knee and difficulty with weight bearing on Rt lower extremity x 1 day without injury. EXAM: RIGHT KNEE - COMPLETE 4+ VIEW COMPARISON:  03/30/2005 FINDINGS: Mild degenerative changes in the right knee with mild medial compartment narrowing and small osteophyte formation. No significant effusion. No evidence of acute fracture or subluxation. No focal bone lesion or bone destruction. Bone cortex and trabecular architecture appear intact. No radiopaque soft tissue foreign bodies. IMPRESSION: Mild degenerative changes in the right knee. No acute bony abnormalities. Electronically Signed   By: Burman NievesWilliam  Stevens M.D.   On: 07/20/2016 02:43    Procedures Procedures (including critical care time)  Medications Ordered in ED Medications - No data to display   Initial Impression / Assessment and Plan / ED Course  I have reviewed the triage vital signs and the nursing notes.  Pertinent labs & imaging results that were available during my care of  the patient were reviewed by me and considered in my medical decision making (see chart for details).  Clinical Course    Images reviewed and discussed with pt.  No exam findings to suggest dvt.  Pt does not miss doses of his xarelto so should be therapeutic.  Pain is worsened with activty which indicates osteoarthritis as the primary source of pain.  Encouraged activity as tolerated. Tylenol primary, added tramadol for additional pain relief. F/u with pcp prn.  Final Clinical Impressions(s) / ED Diagnoses   Final diagnoses:  Osteoarthritis of right knee, unspecified osteoarthritis type    New Prescriptions Discharge Medication List as of 07/20/2016  1:29 AM    START taking these medications   Details  traMADol (ULTRAM) 50 MG tablet Take 1 tablet (50 mg total) by mouth every  6 (six) hours as needed., Starting Fri 07/20/2016, Print         Burgess Amor, PA-C 07/21/16 1258    Maia Plan, MD 07/22/16 [phone]

## 2016-08-02 ENCOUNTER — Ambulatory Visit: Payer: Self-pay | Admitting: Physician Assistant

## 2016-09-17 ENCOUNTER — Ambulatory Visit: Payer: Self-pay | Admitting: Physician Assistant

## 2016-09-18 ENCOUNTER — Ambulatory Visit: Payer: Self-pay | Admitting: Physician Assistant

## 2016-09-18 ENCOUNTER — Encounter: Payer: Self-pay | Admitting: Physician Assistant

## 2016-09-18 ENCOUNTER — Other Ambulatory Visit: Payer: Self-pay | Admitting: Physician Assistant

## 2016-09-18 DIAGNOSIS — Z79899 Other long term (current) drug therapy: Secondary | ICD-10-CM

## 2016-09-18 MED ORDER — GLIPIZIDE 5 MG PO TABS: 5.0000 mg | ORAL_TABLET | Freq: Two times a day (BID) | ORAL | 1 refills | Status: DC

## 2016-09-18 NOTE — Progress Notes (Signed)
Pt is here today due to not being able to afford medications. Pt is currently filling all medications at [facility]. Pt is out of glipizide XL and has not filled yet due to financial reasons.  Pt was given the option to get a 2 week supply of gl[facility]iz[facility]. Pt preferred it be sent to [facility] because he believes he can also get it for $4 there. Rx for glipizide 5 mg BID is to [facility].  Pt is to get signed up for [facility] to receive most medications for free through the mail. Application has been filled out. Pt brought in pay stubs for pay periods 08-09-16 - 08-15-16 and 08-16-16 - 08-22-16.  Pt is to bring the following: -pay stubs for pay periods:   - 08-23-16 - 08-29-16  - 08-30-16 - 09-05-16  - 09-06-16 - 09-12-16  - 09-13-16 - 09-19-16 -first 2 pages of 1040 tax return for year 2016 -proof of address  Pt has f/u appointment 10-04-16. Pt states he will bring missing paperwork at this appointment.

## 2016-09-24 ENCOUNTER — Other Ambulatory Visit: Payer: Self-pay | Admitting: Physician Assistant

## 2016-09-24 MED ORDER — ALBUTEROL SULFATE (2.5 MG/3ML) 0.083% IN NEBU: 2.5000 mg | INHALATION_SOLUTION | Freq: Four times a day (QID) | RESPIRATORY_TRACT | 2 refills | Status: DC | PRN

## 2016-09-24 MED ORDER — GLIPIZIDE 5 MG PO TABS
5.0000 mg | ORAL_TABLET | Freq: Two times a day (BID) | ORAL | 1 refills | Status: DC
Start: 2016-09-24 — End: 2022-08-05

## 2016-09-24 MED ORDER — METOPROLOL TARTRATE 25 MG PO TABS: 25.0000 mg | ORAL_TABLET | Freq: Two times a day (BID) | ORAL | 2 refills | Status: DC

## 2016-09-24 MED ORDER — ALBUTEROL SULFATE HFA 108 (90 BASE) MCG/ACT IN AERS: 1.0000 | INHALATION_SPRAY | Freq: Four times a day (QID) | RESPIRATORY_TRACT | 2 refills | Status: DC | PRN

## 2016-09-26 ENCOUNTER — Other Ambulatory Visit: Payer: Self-pay

## 2016-09-26 DIAGNOSIS — E119 Type 2 diabetes mellitus without complications: Secondary | ICD-10-CM

## 2016-09-26 DIAGNOSIS — I10 Essential (primary) hypertension: Secondary | ICD-10-CM

## 2016-09-27 LAB — COMPREHENSIVE METABOLIC PANEL
ALBUMIN: 4.1 g/dL (ref 3.6–5.1)
ALT: 27 U/L (ref 9–46)
AST: 29 U/L (ref 10–35)
Alkaline Phosphatase: 47 U/L (ref 40–115)
BUN: 14 mg/dL (ref 7–25)
CHLORIDE: 104 mmol/L (ref 98–110)
CO2: 25 mmol/L (ref 20–31)
CREATININE: 1.07 mg/dL (ref 0.70–1.33)
Calcium: 9.1 mg/dL (ref 8.6–10.3)
Glucose, Bld: 124 mg/dL — ABNORMAL HIGH (ref 65–99)
Potassium: 4.5 mmol/L (ref 3.5–5.3)
SODIUM: 139 mmol/L (ref 135–146)
TOTAL PROTEIN: 6.9 g/dL (ref 6.1–8.1)
Total Bilirubin: 0.8 mg/dL (ref 0.2–1.2)

## 2016-09-28 ENCOUNTER — Other Ambulatory Visit: Payer: Self-pay

## 2016-09-28 ENCOUNTER — Encounter: Payer: Self-pay | Admitting: Physician Assistant

## 2016-09-28 LAB — HEMOGLOBIN A1C
Hgb A1c MFr Bld: 6.3 % — ABNORMAL HIGH (ref <5.7)
MEAN PLASMA GLUCOSE: 134 mg/dL

## 2016-10-01 ENCOUNTER — Encounter: Payer: Self-pay | Admitting: Physician Assistant

## 2016-10-01 ENCOUNTER — Ambulatory Visit: Payer: Self-pay | Admitting: Physician Assistant

## 2016-10-01 VITALS — BP 120/86 | HR 87 | Temp 97.5°F | Ht 71.0 in | Wt 302.4 lb

## 2016-10-01 DIAGNOSIS — I10 Essential (primary) hypertension: Secondary | ICD-10-CM

## 2016-10-01 DIAGNOSIS — J449 Chronic obstructive pulmonary disease, unspecified: Secondary | ICD-10-CM

## 2016-10-01 DIAGNOSIS — E119 Type 2 diabetes mellitus without complications: Secondary | ICD-10-CM

## 2016-10-01 DIAGNOSIS — Z86711 Personal history of pulmonary embolism: Secondary | ICD-10-CM

## 2016-10-01 MED ORDER — ALBUTEROL SULFATE HFA 108 (90 BASE) MCG/ACT IN AERS: 1.0000 | INHALATION_SPRAY | Freq: Four times a day (QID) | RESPIRATORY_TRACT | 1 refills | Status: DC | PRN

## 2016-10-01 NOTE — Progress Notes (Signed)
BP 120/86 (BP Location: Left Arm, Patient Position: Sitting, Cuff Size: Large)   Pulse 87   Temp 97.5 F (36.4 C)   Ht 5\' 11"  (1.803 m)   Wt (!) 302 lb 6.4 oz (137.2 kg)   SpO2 97%   BMI 42.18 kg/m    Subjective:    Patient ID: Gary Burke, male    DOB: 03/12/1961, 55 y.o.   MRN: [id number]  HPI: Gary KeenLarry W Pieri is a 55 y.o. male presenting on 10/01/2016 for Diabetes   HPI   [facility] b/c he only got around to bringing in needed paperwork last week  Pt starts at [employer] tomorrow  He gets [employer] that starts January 1  He feels well today  Relevant past medical, surgical, family and social history reviewed and updated as indicated. Interim medical history since our last visit reviewed. Allergies and medications reviewed and updated.   Current Outpatient Prescriptions:  .  albuterol (PROVENTIL) (2.5 MG/3ML) 0.083% nebulizer solution, Take 3 mLs (2.5 mg total) by nebulization every 6 (six) hours as needed for wheezing or shortness of breath., Disp: 75 mL, Rfl: 2 .  aspirin 81 MG tablet, Take 81 mg by mouth daily., Disp: , Rfl:  .  glipiZIDE (GLUCOTROL XL) 5 MG 24 hr tablet, Take 5 mg by mouth daily with breakfast., Disp: , Rfl:  .  metoprolol tartrate (LOPRESSOR) 25 MG tablet, Take 1 tablet (25 mg total) by mouth 2 (two) times daily. (Patient taking differently: Take 25 mg by mouth daily. ), Disp: 180 tablet, Rfl: 2 .  rivaroxaban (XARELTO) 20 MG TABS tablet, Take 20 mg by mouth daily with supper., Disp: , Rfl:  .  albuterol (PROVENTIL HFA;VENTOLIN HFA) 108 (90 Base) MCG/ACT inhaler, Inhale 1-2 puffs into the lungs every 6 (six) hours as needed for wheezing or shortness of breath. (Patient not taking: Reported on 10/01/2016), Disp: 3 Inhaler, Rfl: 2 .  glipiZIDE (GLUCOTROL) 5 MG tablet, Take 1 tablet (5 mg total) by mouth 2 (two) times daily before a meal. (Patient not taking: Reported on 10/01/2016), Disp: 180 tablet, Rfl: 1   Review of  Systems  Constitutional: Negative for appetite change, chills, diaphoresis, fatigue, fever and unexpected weight change.  HENT: Negative for congestion, dental problem, drooling, ear pain, facial swelling, hearing loss, mouth sores, sneezing, sore throat, trouble swallowing and voice change.   Eyes: Negative for pain, discharge, redness, itching and visual disturbance.  Respiratory: Negative for cough, choking, shortness of breath and wheezing.   Cardiovascular: Negative for chest pain, palpitations and leg swelling.  Gastrointestinal: Negative for abdominal pain, blood in stool, constipation, diarrhea and vomiting.  Endocrine: Negative for cold intolerance, heat intolerance and polydipsia.  Genitourinary: Negative for decreased urine volume, dysuria and hematuria.  Musculoskeletal: Negative for arthralgias, back pain and gait problem.  Skin: Negative for rash.  Allergic/Immunologic: Negative for environmental allergies.  Neurological: Negative for seizures, syncope, light-headedness and headaches.  Hematological: Negative for adenopathy.  Psychiatric/Behavioral: Negative for agitation, dysphoric mood and suicidal ideas. The patient is not nervous/anxious.     Per HPI unless specifically indicated above     Objective:    BP 120/86 (BP Location: Left Arm, Patient Position: Sitting, Cuff Size: Large)   Pulse 87   Temp 97.5 F (36.4 C)   Ht 5\' 11"  (1.803 m)   Wt (!) 302 lb 6.4 oz (137.2 kg)   SpO2 97%   BMI 42.18 kg/m   Wt  Readings from Last 3 Encounters:  10/01/16 (!) 302 lb 6.4 oz (137.2 kg)  07/19/16 292 lb (132.5 kg)  07/05/16 296 lb 3.2 oz (134.4 kg)    Physical Exam  Constitutional: He is oriented to person, place, and time. He appears well-developed and well-nourished.  HENT:  Head: Normocephalic and atraumatic.  Neck: Neck supple.  Cardiovascular: Normal rate and regular rhythm.   Pulmonary/Chest: Effort normal and breath sounds normal. He has no wheezes.  Abdominal:  Soft. Bowel sounds are normal. There is no hepatosplenomegaly. There is no tenderness.  Musculoskeletal: He exhibits no edema.  Lymphadenopathy:    He has no cervical adenopathy.  Neurological: He is alert and oriented to person, place, and time.  Skin: Skin is warm and dry.  Psychiatric: He has a normal mood and affect. His behavior is normal.  Vitals reviewed.   Results for orders placed or performed in visit on 09/26/16  Comprehensive Metabolic Panel (CMET)  Result Value Ref Range   Sodium 139 135 - 146 mmol/L   Potassium 4.5 3.5 - 5.3 mmol/L   Chloride 104 98 - 110 mmol/L   CO2 25 20 - 31 mmol/L   Glucose, Bld 124 (H) 65 - 99 mg/dL   BUN 14 7 - 25 mg/dL   Creat 1.611.07 0.960.70 - 0.451.33 mg/dL   Total Bilirubin 0.8 0.2 - 1.2 mg/dL   Alkaline Phosphatase 47 40 - 115 U/L   AST 29 10 - 35 U/L   ALT 27 9 - 46 U/L   Total Protein 6.9 6.1 - 8.1 g/dL   Albumin 4.1 3.6 - 5.1 g/dL   Calcium 9.1 8.6 - 40.910.3 mg/dL  HgB W1XA1c  Result Value Ref Range   Hgb A1c MFr Bld 6.3 (H) <5.7 %   Mean Plasma Glucose 134 mg/dL      Assessment & Plan:   Encounter Diagnoses  Name Primary?  . Controlled type 2 diabetes mellitus without complication, unspecified long term insulin use status (HCC) Yes  . Chronic obstructive pulmonary disease, unspecified COPD type (HCC)   . Essential hypertension, benign   . Morbid obesity, unspecified obesity type (HCC)   . History of pulmonary embolism     -reviewed labs with pt -continue currrent medications -Pt counseled to wear sox -counseled pt to get established with new dr in January -counseled pt to Watch weight- he has put on 6 pounds in last 3 months.  Discussed diet and exercise -F/u here if neeeded prior to 10/29/16

## 2016-10-04 ENCOUNTER — Ambulatory Visit: Payer: Self-pay | Admitting: Physician Assistant

## 2016-10-12 ENCOUNTER — Encounter (HOSPITAL_COMMUNITY): Payer: Self-pay | Admitting: Emergency Medicine

## 2016-10-12 ENCOUNTER — Emergency Department (HOSPITAL_COMMUNITY)
Admission: EM | Admit: 2016-10-12 | Discharge: 2016-10-13 | Disposition: A | Discharge status: Home / Self Care | Payer: Self-pay | Attending: Emergency Medicine | Admitting: Emergency Medicine

## 2016-10-12 ENCOUNTER — Emergency Department (HOSPITAL_COMMUNITY): Payer: Self-pay

## 2016-10-12 DIAGNOSIS — E119 Type 2 diabetes mellitus without complications: Secondary | ICD-10-CM | POA: Insufficient documentation

## 2016-10-12 DIAGNOSIS — Z7982 Long term (current) use of aspirin: Secondary | ICD-10-CM | POA: Insufficient documentation

## 2016-10-12 DIAGNOSIS — Z79899 Other long term (current) drug therapy: Secondary | ICD-10-CM | POA: Insufficient documentation

## 2016-10-12 DIAGNOSIS — R251 Tremor, unspecified: Secondary | ICD-10-CM | POA: Insufficient documentation

## 2016-10-12 DIAGNOSIS — Z7984 Long term (current) use of oral hypoglycemic drugs: Secondary | ICD-10-CM | POA: Insufficient documentation

## 2016-10-12 DIAGNOSIS — I10 Essential (primary) hypertension: Secondary | ICD-10-CM | POA: Insufficient documentation

## 2016-10-12 DIAGNOSIS — Z87891 Personal history of nicotine dependence: Secondary | ICD-10-CM | POA: Insufficient documentation

## 2016-10-12 DIAGNOSIS — R0602 Shortness of breath: Secondary | ICD-10-CM | POA: Insufficient documentation

## 2016-10-12 LAB — URINALYSIS, ROUTINE W REFLEX MICROSCOPIC
Bilirubin Urine: NEGATIVE
GLUCOSE, UA: NEGATIVE mg/dL
HGB URINE DIPSTICK: NEGATIVE
Ketones, ur: NEGATIVE mg/dL
LEUKOCYTES UA: NEGATIVE
Nitrite: NEGATIVE
PH: 5 (ref 5.0–8.0)
Protein, ur: NEGATIVE mg/dL
Specific Gravity, Urine: 1.016 (ref 1.005–1.030)

## 2016-10-12 LAB — CBC WITH DIFFERENTIAL/PLATELET
BASOS PCT: 1 %
Basophils Absolute: 0.1 10*3/uL (ref 0.0–0.1)
EOS ABS: 0.2 10*3/uL (ref 0.0–0.7)
EOS PCT: 2 %
HCT: 44.9 % (ref 39.0–52.0)
Hemoglobin: 15.3 g/dL (ref 13.0–17.0)
LYMPHS ABS: 3.5 10*3/uL (ref 0.7–4.0)
Lymphocytes Relative: 37 %
MCH: 29.9 pg (ref 26.0–34.0)
MCHC: 34.1 g/dL (ref 30.0–36.0)
MCV: 87.9 fL (ref 78.0–100.0)
MONO ABS: 0.7 10*3/uL (ref 0.1–1.0)
MONOS PCT: 7 %
Neutro Abs: 5.2 10*3/uL (ref 1.7–7.7)
Neutrophils Relative %: 53 %
PLATELETS: 199 10*3/uL (ref 150–400)
RBC: 5.11 MIL/uL (ref 4.22–5.81)
RDW: 13.8 % (ref 11.5–15.5)
WBC: 9.7 10*3/uL (ref 4.0–10.5)

## 2016-10-12 LAB — COMPREHENSIVE METABOLIC PANEL
ALK PHOS: 39 U/L (ref 38–126)
ALT: 25 U/L (ref 17–63)
AST: 28 U/L (ref 15–41)
Albumin: 4 g/dL (ref 3.5–5.0)
Anion gap: 8 (ref 5–15)
BILIRUBIN TOTAL: 1 mg/dL (ref 0.3–1.2)
BUN: 16 mg/dL (ref 6–20)
CALCIUM: 9.3 mg/dL (ref 8.9–10.3)
CO2: 24 mmol/L (ref 22–32)
CREATININE: 1 mg/dL (ref 0.61–1.24)
Chloride: 102 mmol/L (ref 101–111)
Glucose, Bld: 116 mg/dL — ABNORMAL HIGH (ref 65–99)
Potassium: 3.8 mmol/L (ref 3.5–5.1)
Sodium: 134 mmol/L — ABNORMAL LOW (ref 135–145)
TOTAL PROTEIN: 7 g/dL (ref 6.5–8.1)

## 2016-10-12 LAB — CBG MONITORING, ED: GLUCOSE-CAPILLARY: 138 mg/dL — AB (ref 65–99)

## 2016-10-12 LAB — TROPONIN I

## 2016-10-12 NOTE — ED Notes (Signed)
Pr going to xray

## 2016-10-12 NOTE — ED Provider Notes (Signed)
[facility]-EMER[facility]Y DEPT Provider Note   CSN: [id number] Arrival date & time: 10/12/16  [id number]     History   Chief Complaint Chief Complaint  Patient presents with  . Shaking  . Urinary Frequency    HPI Gary Burke is a 55 y.o. male.  HPI  Pt was seen at 2130. Per pt, c/o gradual onset and persistence of multiple intermittent complaints for the past week. Complaints include: "urinating a lot," "hands feel shaky," and "worried about my diabetes." States he feels the urge to urinate but "sometimes can't make it to the bathroom in time." Pt states he "sometimes" briefly gets SOB which began after he was dx with PE in 2016. Endorses compliance with xarelto. States last night at work he had an episode of "feeling like my blood sugar dropped" which completely improved after he sat and rested and ate a candy bar. Denies SOB currently. Denies CP/palpitations, no cough, no abd pain, no N/V/D, no dysuria/hematuria, no neck pain, no back pain, no visual changes, no focal motor weakness, no tingling/numbness in extremities, no ataxia, no slurred speech, no facial droop. Denies incont/retention of bowel or bladder, no saddle anesthesia, no fevers, no injury.   Past Medical History:  Diagnosis Date  . Bipolar disorder (HCC)   . Diabetes mellitus without complication (HCC) 04/2016  . Hypertension   . Pulmonary embolism (HCC)    L lung    Patient Active Problem List   Diagnosis Date Noted  . History of colonic polyps 07/05/2016  . Abnormal chest CT 07/05/2016  . Controlled type 2 diabetes mellitus without complication (HCC) 07/05/2016  . Essential hypertension, benign 05/30/2016  . History of pulmonary embolism 05/30/2016  . Morbid obesity (HCC) 05/30/2016  . Chronic obstructive pulmonary disease (HCC) 05/30/2016  . Long term current use of anticoagulant therapy 05/30/2016    Past Surgical History:  Procedure Laterality Date  . APPENDECTOMY  age 55       Home Medications    Prior to  Admission medications   Medication Sig Start Date End Date Taking? Authorizing Provider  albuterol (PROVENTIL HFA;VENTOLIN HFA) 108 (90 Base) MCG/ACT inhaler Inhale 1-2 puffs into the lungs every 6 (six) hours as needed for wheezing or shortness of breath. 10/01/16  Yes Jacquelin HawkingShannon McElroy, PA-C  albuterol (PROVENTIL) (2.5 MG/3ML) 0.083% nebulizer solution Take 3 mLs (2.5 mg total) by nebulization every 6 (six) hours as needed for wheezing or shortness of breath. 09/24/16  Yes Jacquelin HawkingShannon McElroy, PA-C  aspirin 81 MG tablet Take 81 mg by mouth daily.   Yes Historical Provider, MD  glipiZIDE (GLUCOTROL) 5 MG tablet Take 1 tablet (5 mg total) by mouth 2 (two) times daily before a meal. Patient taking differently: Take 5 mg by mouth daily.  09/24/16  Yes Jacquelin HawkingShannon McElroy, PA-C  metoprolol tartrate (LOPRESSOR) 25 MG tablet Take 1 tablet (25 mg total) by mouth 2 (two) times daily. Patient taking differently: Take 25 mg by mouth daily.  09/24/16  Yes Jacquelin HawkingShannon McElroy, PA-C  rivaroxaban (XARELTO) 20 MG TABS tablet Take 20 mg by mouth daily.    Yes Historical Provider, MD    Family History Family History  Problem Relation Age of Onset  . Diabetes Daughter     Social History Social History  Substance Use Topics  . Smoking status: Former Smoker    Packs/day: 0.50    Years: 37.00    Types: Cigarettes    Quit date: 10/29/2014  . Smokeless tobacco: Never Used  . Alcohol use No  Allergies   Penicillins   Review of Systems Review of Systems ROS: Statement: All systems negative except as marked or noted in the HPI; Constitutional: Negative for fever and chills. +"shaky hands."; ; Eyes: Negative for eye pain, redness and discharge. ; ; ENMT: Negative for ear pain, hoarseness, nasal congestion, sinus pressure and sore throat. ; ; Cardiovascular: Negative for chest pain, palpitations, diaphoresis, dyspnea and peripheral edema. ; ; Respiratory: Negative for cough, wheezing and stridor. ; ; Gastrointestinal:  Negative for nausea, vomiting, diarrhea, abdominal pain, blood in stool, hematemesis, jaundice and rectal bleeding. . ; ; Genitourinary: +"urinated a lot." Negative for dysuria, flank pain and hematuria. ; ; Genital:  No penile drainage or rash, no testicular pain or swelling, no scrotal rash or swelling. ;; Musculoskeletal: Negative for back pain and neck pain. Negative for swelling and trauma.; ; Skin: Negative for pruritus, rash, abrasions, blisters, bruising and skin lesion.; ; Neuro: Negative for headache, lightheadedness and neck stiffness. Negative for weakness, altered level of consciousness, altered mental status, extremity weakness, paresthesias, involuntary movement, seizure and syncope.       Physical Exam Updated Vital Signs BP 118/94 (BP Location: Left Arm)   Pulse 71   Temp 98 F (36.7 C) (Oral)   Resp 18   Ht 5\' 8"OobayomtUHc$  (1.727 m)   Wt (!) 305 lb (138.3 kg)   SpO2 94%   BMI 46.38 kg/m   22:31 Orthostatic Vital Signs TW  Orthostatic Lying   BP- Lying:  119/92  Pulse- Lying: 76      Orthostatic Sitting  BP- Sitting: 129/90  Pulse- Sitting: 72      Orthostatic Standing at 0 minutes  BP- Standing at 0 minutes:  114/94  Pulse- Standing at 0 minutes: 89     Physical Exam 2135: Physical examination:  Nursing notes reviewed; Vital signs and O2 SAT reviewed;  Constitutional: Well developed, Well nourished, Well hydrated, In no acute distress; Head:  Normocephalic, atraumatic; Eyes: EOMI, PERRL, No scleral icterus; ENMT: Mouth and pharynx normal, Mucous membranes moist; Neck: Supple, Full range of motion, No lymphadenopathy; Cardiovascular: Regular rate and rhythm, No gallop; Respiratory: Breath sounds clear & equal bilaterally, No wheezes.  Speaking full sentences with ease, Normal respiratory effort/excursion; Chest: Nontender, Movement normal; Abdomen: Soft, Nontender, Nondistended, Normal bowel sounds; Genitourinary: No CVA tenderness; Spine:  No midline CS, TS, LS  tenderness.;;  Extremities: Pulses normal, No tenderness, No edema, No calf edema or asymmetry.; Neuro: AA&Ox3, Major CN grossly intact. No facial droop. Speech clear. No gross focal motor or sensory deficits in extremities. Climbs on and off stretcher easily by himself. Gait steady.; Skin: Color normal, Warm, Dry.   ED Treatments / Results  Labs (all labs ordered are listed, but only abnormal results are displayed)   EKG  EKG Interpretation  Date/Time:  Friday October 12 2016 21:52:13 EST Ventricular Rate:  71 PR Interval:    QRS Duration: 101 QT Interval:  367 QTC Calculation: 399 R Axis:   18 Text Interpretation:  Sinus rhythm Borderline prolonged PR interval Baseline wander Artifact When compared with ECG of 06/16/2016 No significant change was found Confirmed by [facility]  MD, Nicholos JohnsKATHLEEN [phone]([id number]) on 10/12/2016 10:32:24 PM       Radiology   Procedures Procedures (including critical care time)  Medications Ordered in ED Medications - No data to display   Initial Impression / Assessment and Plan / ED Course  I have reviewed the triage vital signs and the nursing notes.  Pertinent labs &  imaging results that were available during my care of the patient were reviewed by me and considered in my medical decision making (see chart for details).  MDM Reviewed: previous chart, nursing note and vitals Reviewed previous: labs and ECG Interpretation: labs, ECG and x-ray   Results for orders placed or performed during the hospital encounter of 10/12/16  Urinalysis, Routine w reflex microscopic  Result Value Ref Range   Color, Urine YELLOW YELLOW   APPearance CLEAR CLEAR   Specific Gravity, Urine 1.016 1.005 - 1.030   pH 5.0 5.0 - 8.0   Glucose, UA NEGATIVE NEGATIVE mg/dL   Hgb urine dipstick NEGATIVE NEGATIVE   Bilirubin Urine NEGATIVE NEGATIVE   Ketones, ur NEGATIVE NEGATIVE mg/dL   Protein, ur NEGATIVE NEGATIVE mg/dL   Nitrite NEGATIVE NEGATIVE   Leukocytes, UA NEGATIVE  NEGATIVE  Comprehensive metabolic panel  Result Value Ref Range   Sodium 134 (L) 135 - 145 mmol/L   Potassium 3.8 3.5 - 5.1 mmol/L   Chloride 102 101 - 111 mmol/L   CO2 24 22 - 32 mmol/L   Glucose, Bld 116 (H) 65 - 99 mg/dL   BUN 16 6 - 20 mg/dL   Creatinine, Ser 1.19 0.61 - 1.24 mg/dL   Calcium 9.3 8.9 - 14.7 mg/dL   Total Protein 7.0 6.5 - 8.1 g/dL   Albumin 4.0 3.5 - 5.0 g/dL   AST 28 15 - 41 U/L   ALT 25 17 - 63 U/L   Alkaline Phosphatase 39 38 - 126 U/L   Total Bilirubin 1.0 0.3 - 1.2 mg/dL   GFR calc non Af Amer >60 >60 mL/min   GFR calc Af Amer >60 >60 mL/min   Anion gap 8 5 - 15  Troponin I  Result Value Ref Range   Troponin I <0.03 <0.03 ng/mL  CBC with Differential  Result Value Ref Range   WBC 9.7 4.0 - 10.5 K/uL   RBC 5.11 4.22 - 5.81 MIL/uL   Hemoglobin 15.3 13.0 - 17.0 g/dL   HCT 82.9 56.2 - 13.0 %   MCV 87.9 78.0 - 100.0 fL   MCH 29.9 26.0 - 34.0 pg   MCHC 34.1 30.0 - 36.0 g/dL   RDW 86.5 78.4 - 69.6 %   Platelets 199 150 - 400 K/uL   Neutrophils Relative % 53 %   Neutro Abs 5.2 1.7 - 7.7 K/uL   Lymphocytes Relative 37 %   Lymphs Abs 3.5 0.7 - 4.0 K/uL   Monocytes Relative 7 %   Monocytes Absolute 0.7 0.1 - 1.0 K/uL   Eosinophils Relative 2 %   Eosinophils Absolute 0.2 0.0 - 0.7 K/uL   Basophils Relative 1 %   Basophils Absolute 0.1 0.0 - 0.1 K/uL  Troponin I  Result Value Ref Range   Troponin I <0.03 <0.03 ng/mL  Brain natriuretic peptide  Result Value Ref Range   B Natriuretic Peptide 28.0 0.0 - 100.0 pg/mL  CBG monitoring, ED  Result Value Ref Range   Glucose-Capillary 138 (H) 65 - 99 mg/dL   Dg Chest 2 View Result Date: 10/12/2016 CLINICAL DATA:  Initial evaluation for occasional shortness of breath. EXAM: CHEST  2 VIEW COMPARISON:  Prior radiograph from 09/28/2016. FINDINGS: Transverse heart size at the upper limits of normal. Mediastinal silhouette within normal limits. Lungs normally inflated. Mild vascular congestion without overt  pulmonary edema. No pleural effusion. No focal infiltrates. No pneumothorax. No acute osseus abnormality. IMPRESSION: 1. Mild diffuse vascular congestion without overt pulmonary edema.  2. No other active cardiopulmonary disease identified. Electronically Signed   By: Rise Mu M.D.   On: 10/12/2016 23:02    0015:   Approximately on bladder scan (pt states he last urinated 2+ hours before scan performed). Pt urinated upon request in ED without difficulty. Ambulated with steady gait. Not orthostatic on VS. Troponin negative x2. Endorses compliance with xarelto; doubt PE. Upon re-evaluation: pt laying comfortably on stretcher talking with family and watching TV. Now states he is here for work note. Strongly encouraged f/u with PMD. Dx and testing d/w pt and family.  Questions answered.  Verb understanding, agreeable to d/c home with outpt f/u.   Final Clinical Impressions(s) / ED Diagnoses   Final diagnoses:  None    New Prescriptions New Prescriptions   No medications on file     Samuel Jester, DO 10/15/16 0015

## 2016-10-12 NOTE — ED Triage Notes (Signed)
Pt states that he has been having some SOB with shakiness, states he is concerned with his diabetes

## 2016-10-13 LAB — TROPONIN I

## 2016-10-13 LAB — BRAIN NATRIURETIC PEPTIDE: B NATRIURETIC PEPTIDE 5: 28 pg/mL (ref 0.0–100.0)

## 2016-10-13 NOTE — ED Notes (Signed)
Pt states understanding of care given and follow up instructions.  Pt ambulated from ED with significant other.  A/O x 4, steady gait

## 2016-10-13 NOTE — Discharge Instructions (Signed)
Take your usual prescriptions as previously directed. Follow your diabetic diet and eat regular meals.  Call your regular medical doctor on Monday to schedule a follow up appointment within the next 3 days.  Return to the Emergency Department immediately sooner if worsening.

## 2016-10-15 LAB — URINE CULTURE

## 2016-11-05 ENCOUNTER — Other Ambulatory Visit: Payer: Self-pay | Admitting: Physician Assistant

## 2016-11-05 MED ORDER — METOPROLOL TARTRATE 25 MG PO TABS: 25.0000 mg | ORAL_TABLET | Freq: Two times a day (BID) | ORAL | 0 refills | Status: DC

## 2017-09-26 DIAGNOSIS — I861 Scrotal varices: Secondary | ICD-10-CM | POA: Insufficient documentation

## 2018-04-24 DIAGNOSIS — Z6841 Body Mass Index (BMI) 40.0 and over, adult: Secondary | ICD-10-CM | POA: Insufficient documentation

## 2018-06-21 IMAGING — CT CT ANGIO CHEST
3 of 12 series · 11 of 36 positions shown · IV contrast (ISOVUE)
Comparison: Chest radiograph 06/16/2016

CLINICAL DATA: Shortness of breath and left-sided chest pain.
Previous pulmonary embolism.

EXAM:
CT ANGIOGRAPHY CHEST WITH CONTRAST
TECHNIQUE: Multidetector CT imaging of the chest was performed using the
standard protocol during bolus administration of intravenous
contrast. Multiplanar CT image reconstructions and MIPs were
obtained to evaluate the vascular anatomy.
CONTRAST:  185 mL Isovue 370 over 2 injections.

[Series 5: pe 2.0 · axial · 0.74mm/px · z∈[-282,-172]mm · 2 of 165 slices shown]
[im 55/165  lung]
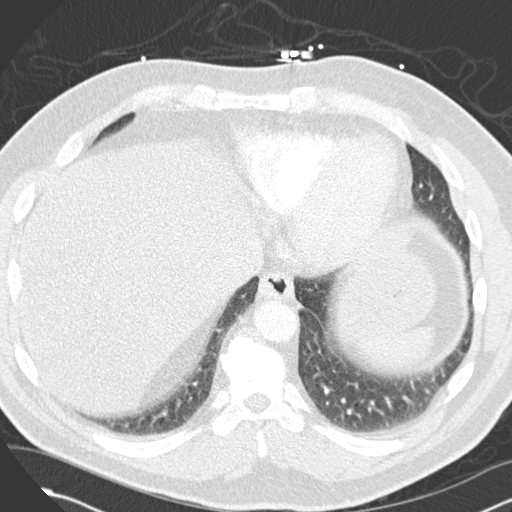
[im 110/165  lung]
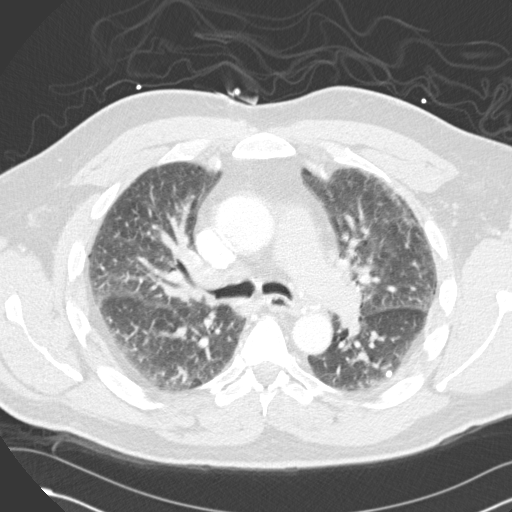

[Series 6: pe thins 1.0 · axial · 0.71mm/px · z∈[-354,-98]mm · 8 of 329 slices shown]
[im 37/329  lung]
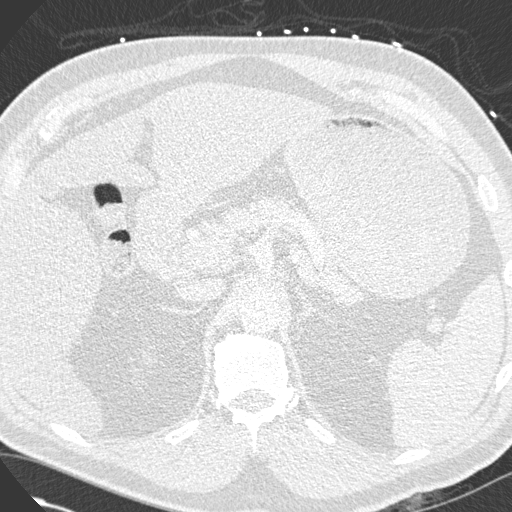
[im 73/329  mediastinal]
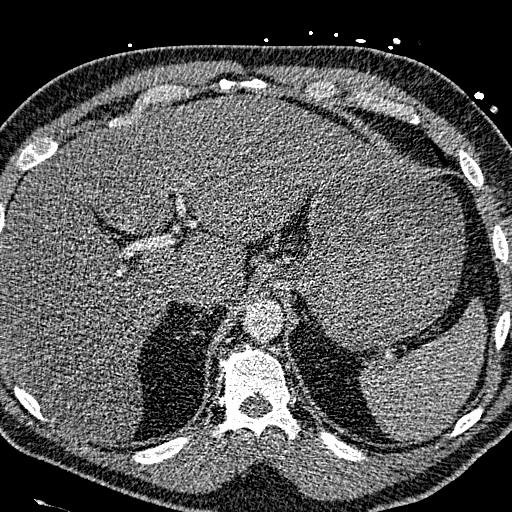
[im 110/329  lung]
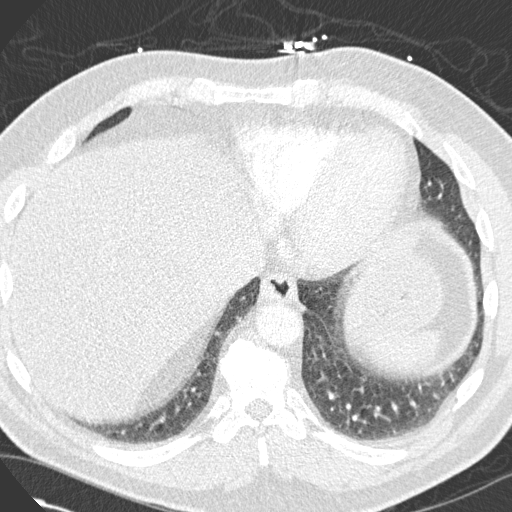
[im 146/329  mediastinal]
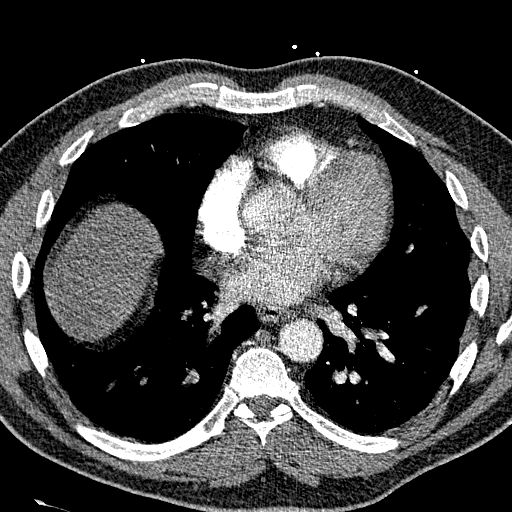
[im 183/329  lung]
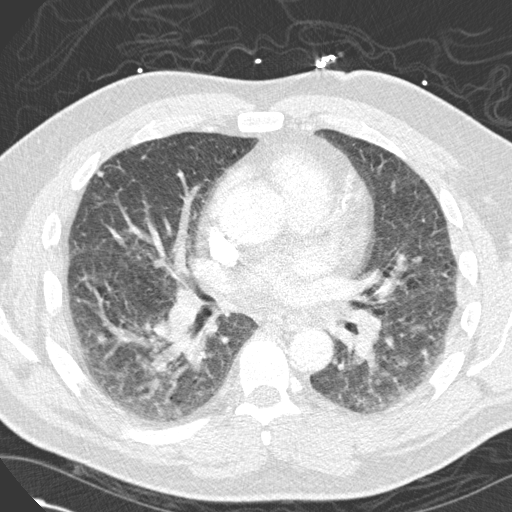
[im 219/329  mediastinal]
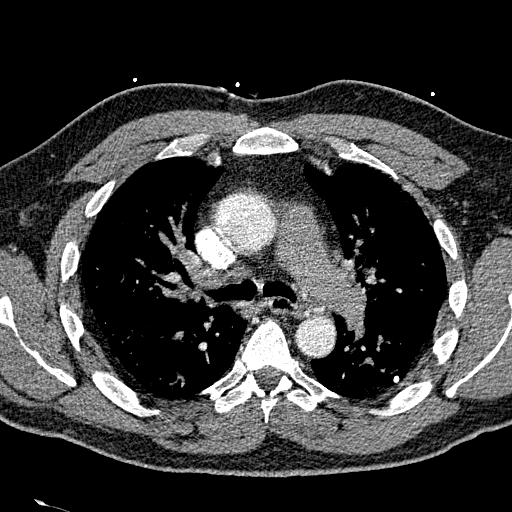
[im 256/329  lung]
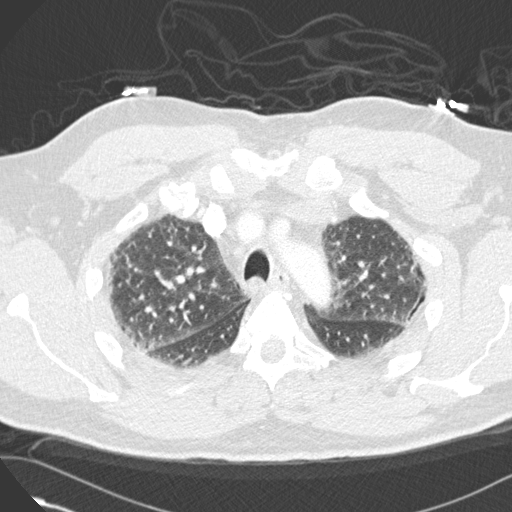
[im 292/329  mediastinal]
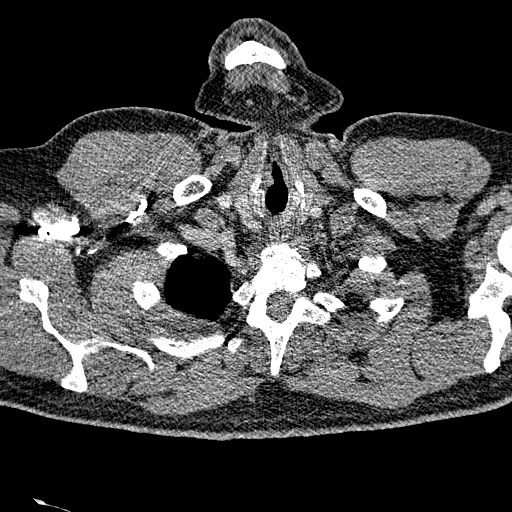

[Series 14: cor mpr 2.0 · coronal · 0.47mm/px · 1 of 151 slices shown]
[im 76/151  mediastinal]
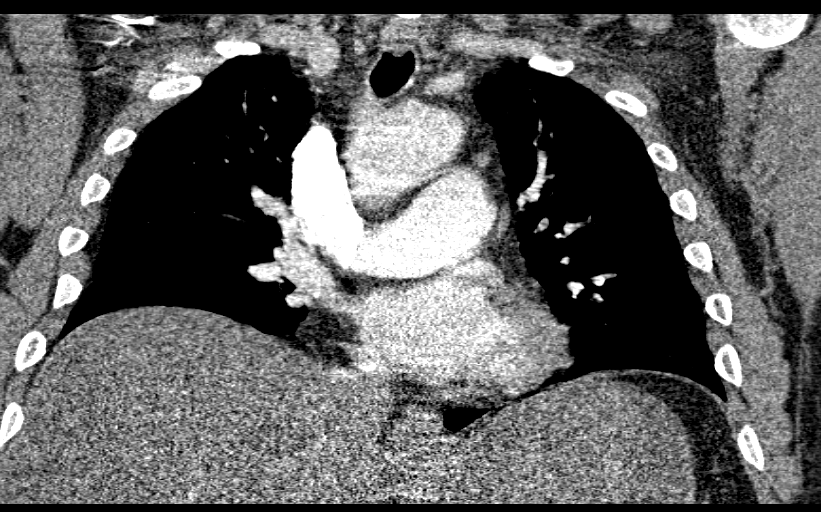

[11 of 36 positions shown; findings below may reference images not displayed]

FINDINGS: Vascular: There is no pulmonary embolus. The main pulmonary artery
is mildly enlarged, measuring 3.5 cm at the bifurcation. The
visualized aorta and arch vessels are normal. Pulmonary veins are
patent.

Mediastinum: No pericardial effusion or cardiomegaly. No mediastinal
adenopathy.

Lungs/Pleura: There is a 5 mm right middle lobe pulmonary nodule
(series 13, image 61), unchanged from 11/21/2015 calcified
granulomata within the left lower lobe (series 13, image 58) is also
unchanged. 9 mm left lower lobe pulmonary nodule (series 13, image
61) is unchanged. No pleural effusion or focal consolidation.

Visualized Abdomen: Contrast bolus timing is not optimized for
evaluation of the abdominal organs. Within that limitation, the
appearance of the liver and spleen is normal.

Musculoskeletal: Multilevel thoracic osteophytosis. No lytic or
blastic osseous lesions.

Review of the MIP images confirms the above findings.
IMPRESSION: 1. No pulmonary embolus.
2. Mild enlargement of the main pulmonary artery, which may be seen
in the setting of pulmonary hypertension.
3. **An incidental finding of potential clinical significance has
been found. Multiple pulmonary nodules, measuring up to 9 mm, which
have been stable for least 6 months. Recommend next follow-up chest
CT in 12-18 months (from today's scan) is considered optional for
low-risk patients, but is recommended for high-risk patients. This
recommendation follows the consensus statement: Guidelines for
Management of Incidental Pulmonary Nodules Detected on CT
Images:From the [HOSPITAL] 9151; published online before
print (10.1148/[id number]). **

## 2022-05-23 DIAGNOSIS — E782 Mixed hyperlipidemia: Secondary | ICD-10-CM | POA: Insufficient documentation

## 2022-05-23 DIAGNOSIS — I7121 Aneurysm of the ascending aorta, without rupture: Secondary | ICD-10-CM | POA: Insufficient documentation

## 2022-07-31 ENCOUNTER — Encounter (HOSPITAL_COMMUNITY): Payer: Self-pay | Admitting: Emergency Medicine

## 2022-07-31 ENCOUNTER — Emergency Department (HOSPITAL_COMMUNITY)
Admission: EM | Admit: 2022-07-31 | Discharge: 2022-07-31 | Disposition: A | Discharge status: Home / Self Care | Payer: Medicaid Other | Attending: Emergency Medicine | Admitting: Emergency Medicine

## 2022-07-31 ENCOUNTER — Emergency Department (HOSPITAL_COMMUNITY): Payer: Medicaid Other

## 2022-07-31 ENCOUNTER — Other Ambulatory Visit: Payer: Self-pay

## 2022-07-31 DIAGNOSIS — Z7901 Long term (current) use of anticoagulants: Secondary | ICD-10-CM | POA: Diagnosis not present

## 2022-07-31 DIAGNOSIS — Z7984 Long term (current) use of oral hypoglycemic drugs: Secondary | ICD-10-CM | POA: Insufficient documentation

## 2022-07-31 DIAGNOSIS — E119 Type 2 diabetes mellitus without complications: Secondary | ICD-10-CM | POA: Diagnosis not present

## 2022-07-31 DIAGNOSIS — I10 Essential (primary) hypertension: Secondary | ICD-10-CM | POA: Diagnosis not present

## 2022-07-31 DIAGNOSIS — Z7982 Long term (current) use of aspirin: Secondary | ICD-10-CM | POA: Insufficient documentation

## 2022-07-31 DIAGNOSIS — M549 Dorsalgia, unspecified: Secondary | ICD-10-CM | POA: Diagnosis present

## 2022-07-31 DIAGNOSIS — M545 Low back pain, unspecified: Secondary | ICD-10-CM | POA: Insufficient documentation

## 2022-07-31 LAB — URINALYSIS, ROUTINE W REFLEX MICROSCOPIC
Bilirubin Urine: NEGATIVE
Glucose, UA: 50 mg/dL — AB
Hgb urine dipstick: NEGATIVE
Ketones, ur: NEGATIVE mg/dL
Leukocytes,Ua: NEGATIVE
Nitrite: NEGATIVE
Protein, ur: 30 mg/dL — AB
Specific Gravity, Urine: 1.031 — ABNORMAL HIGH (ref 1.005–1.030)
pH: 5 (ref 5.0–8.0)

## 2022-07-31 MED ORDER — TIZANIDINE HCL 4 MG PO TABS: 4.0000 mg | ORAL_TABLET | Freq: Four times a day (QID) | ORAL | 0 refills | Status: DC | PRN

## 2022-07-31 MED ORDER — LIDOCAINE 5 % EX PTCH
1.0000 | MEDICATED_PATCH | CUTANEOUS | Status: DC
  Administered 2022-07-31: 1 via TRANSDERMAL
  Filled 2022-07-31: qty 1

## 2022-07-31 MED ORDER — OXYCODONE-ACETAMINOPHEN 5-325 MG PO TABS
1.0000 | ORAL_TABLET | Freq: Once | ORAL | Status: AC
  Administered 2022-07-31: 1 via ORAL
  Filled 2022-07-31: qty 1

## 2022-07-31 NOTE — ED Triage Notes (Signed)
Pt presents with lower back pain with dark urine that started this am, denies back history or injury to back.

## 2022-07-31 NOTE — ED Provider Notes (Signed)
[facility] EMERGENCY DEPARTMENT Provider Note   CSN: [id number] Arrival date & time: 07/31/22  1146     History Chief Complaint  Patient presents with   Back Pain    Gary Burke is a 61 y.o. male with history of diabetes and hypertension presents the emergency room for evaluation of lower right-sided back pain since 0700 this morning.  Patient reports that it was a burning hot like sensation to his right lower back/upper buttock, but has been gradually improving.  He reports that he saw some dark urine this morning and was concerned that he was having a kidney infection.  He denies any dysuria, hematuria, abdominal pain, nausea, vomiting, constipation, diarrhea, chest pain, shortness of breath, fevers, or chills.  He reports it hurts more with standing and walking and is relieved whenever he is sitting down.  No pain medication trialed prior to arrival.  Denies any radiation.  Denies any fevers, chills, IV drug use, fecal incontinence, urinary incontinence, or saddle anesthesia.  Patient reports that he occasionally has a urinary dribble however this is a pre-existing chronic condition for him.   Back Pain Associated symptoms: no abdominal pain, no chest pain, no dysuria and no fever        Home Medications Prior to Admission medications   Medication Sig Start Date End Date Taking? Authorizing Provider  tiZANidine (ZANAFLEX) 4 MG tablet Take 1 tablet (4 mg total) by mouth every 6 (six) hours as needed for muscle spasms. 07/31/22  Yes Achille Rich, PA-C  albuterol (PROVENTIL HFA;VENTOLIN HFA) 108 (90 Base) MCG/ACT inhaler Inhale 1-2 puffs into the lungs every 6 (six) hours as needed for wheezing or shortness of breath. 10/01/16   Jacquelin Hawking, PA-C  albuterol (PROVENTIL) (2.5 MG/3ML) 0.083% nebulizer solution Take 3 mLs (2.5 mg total) by nebulization every 6 (six) hours as needed for wheezing or shortness of breath. 09/24/16   Jacquelin Hawking, PA-C  aspirin 81 MG tablet Take 81 mg  by mouth daily.    [provider]  glipiZIDE (GLUCOTROL) 5 MG tablet Take 1 tablet (5 mg total) by mouth 2 (two) times daily before a meal. Patient taking differently: Take 5 mg by mouth daily.  09/24/16   Jacquelin Hawking, PA-C  metoprolol tartrate (LOPRESSOR) 25 MG tablet Take 1 tablet (25 mg total) by mouth 2 (two) times daily. 11/05/16   Jacquelin Hawking, PA-C  rivaroxaban (XARELTO) 20 MG TABS tablet Take 20 mg by mouth daily.     [provider]      Allergies    Penicillins    Review of Systems   Review of Systems  Constitutional:  Negative for chills and fever.  Respiratory:  Negative for shortness of breath.   Cardiovascular:  Negative for chest pain.  Gastrointestinal:  Negative for abdominal pain, blood in stool, constipation, diarrhea, nausea and vomiting.  Genitourinary:  Negative for dysuria, flank pain and hematuria.  Musculoskeletal:  Positive for back pain.    Physical Exam Updated Vital Signs BP (!) 129/99   Pulse 89   Temp 98.3 F (36.8 C) (Oral)   Resp 20   Ht 6' (1.829 m)   Wt 127.9 kg   SpO2 98%   BMI 38.25 kg/m  Physical Exam Vitals and nursing note reviewed.  Constitutional:      General: He is not in acute distress.    Appearance: Normal appearance. He is not ill-appearing or toxic-appearing.     Comments: Patient is well-appearing  Eyes:  General: No scleral icterus. Pulmonary:     Effort: Pulmonary effort is normal. No respiratory distress.  Abdominal:     Palpations: Abdomen is soft.     Tenderness: There is no abdominal tenderness. There is no right CVA tenderness, left CVA tenderness, guarding or rebound.  Musculoskeletal:       Arms:     Right lower leg: No edema.     Left lower leg: No edema.     Comments: No midline or paraspinal thoracic or cervical tenderness to palpation.  No step-offs or deformities.  Patient has very low right paraspinal lumbar tenderness, near the right gluteal cleft.  Area of pain marked.   Skin:    General: Skin is dry.     Findings: No rash.  Neurological:     General: No focal deficit present.     Mental Status: He is alert. Mental status is at baseline.  Psychiatric:        Mood and Affect: Mood normal.     ED Results / Procedures / Treatments   Labs (all labs ordered are listed, but only abnormal results are displayed) Labs Reviewed  URINALYSIS, ROUTINE W REFLEX MICROSCOPIC - Abnormal; Notable for the following components:      Result Value   Color, Urine AMBER (*)    Specific Gravity, Urine 1.031 (*)    Glucose, UA 50 (*)    Protein, ur 30 (*)    Bacteria, UA RARE (*)    All other components within normal limits    EKG None  Radiology DG Hip Unilat W or Wo Pelvis 2-3 Views Right  Result Date: 07/31/2022 CLINICAL DATA:  right sided pain EXAM: DG HIP (WITH OR WITHOUT PELVIS) 2-3V RIGHT COMPARISON:  None Available. FINDINGS: No acute fracture or dislocation. Mild joint space narrowing of the RIGHT hip. Degenerative changes of the lower lumbar spine. No area of erosion or osseous destruction. No unexpected radiopaque foreign body. Pelvic phleboliths. Atherosclerotic calcifications. IMPRESSION: No acute fracture or dislocation. If persistent concern for nondisplaced hip or pelvic fracture, recommend dedicated pelvic MRI. Electronically Signed   By: Valentino Saxon M.D.   On: 07/31/2022 14:06   DG Lumbar Spine Complete  Result Date: 07/31/2022 CLINICAL DATA:  right sided pain EXAM: LUMBAR SPINE - COMPLETE 4+ VIEW COMPARISON:  October 08, 2012 FINDINGS: There are five non-rib bearing lumbar-type vertebral bodies. Straightening of the lumbar lordosis. RIGHT superior pelvic tilt. Evaluation for spondylolisthesis is limited secondary to rotation. Question mild wedging of L4. Moderate intervertebral disc space height loss at L4-5, L5-S1 and T12-L1. Multilevel endplate proliferative changes. Facet arthropathy. Atherosclerotic calcifications of the aorta. IMPRESSION:  1. Question mild wedging of L4. Recommend correlation with point tenderness. If persistent concern for acute compression fracture, recommend dedicated cross-sectional imaging. 2. RIGHT superior pelvic tilt could reflect underlying muscle spasm. 3.  Atherosclerotic calcifications of the aorta. Electronically Signed   By: Valentino Saxon M.D.   On: 07/31/2022 14:05    Procedures Procedures   Medications Ordered in ED Medications  lidocaine (LIDODERM) 5 % 1 patch (1 patch Transdermal Patch Applied 07/31/22 1417)  oxyCODONE-acetaminophen (PERCOCET/ROXICET) 5-325 MG per tablet 1 tablet (1 tablet Oral Given 07/31/22 1417)    ED Course/ Medical Decision Making/ A&P                           Medical Decision Making Amount and/or Complexity of Data Reviewed Labs: ordered. Radiology: ordered.  Risk Prescription drug  management.   61 year old male presents the emergency room for evaluation of right-sided lower back pain without any red flag symptoms.  Differential diagnosis includes but is not limited to sprain, strain, muscle spasm, UTI, chronic back pain, cauda equina, sciatica.  Vital signs show slightly low blood pressure 129/99 otherwise afebrile, normal pulse rate, satting well on room air without increased work of breathing.  Physical exam as noted above.  We will order some basic imaging and a urinalysis test.  I independently reviewed and interpreted the patient's labs.  Urinalysis shows amber urine that is concentrated with 50 glucose, 30 protein, but rare bacteria and only 0-5 white blood cells.  He does not have any other urinary symptoms, I doubt any UTI.  X-ray imaging shows No acute fracture or dislocation. If persistent concern for nondisplaced hip or pelvic fracture, recommend dedicated pelvic MRI. 1. Question mild wedging of L4. Recommend correlation with point tenderness. If persistent concern for acute compression fracture, recommend dedicated cross-sectional imaging. 2. RIGHT  superior pelvic tilt could reflect underlying muscle spasm. 3.  Atherosclerotic calcifications of the aorta.  I called and spoke to Dr. Meda Klinefelter who was the radiologist who read the images.  I discussed with her that this is atraumatic pain.  She reports that she does not see any signs of hip fracture.  Patient's pain is too low for the pain in the L4 area, however will mention this for him to follow-up with his primary care doctor.  The right superior pelvic tilt could reflect an underlying muscle spasm.  We will send him home with a few muscle relaxers.  Recommend the patient take some Tylenol, cannot use any NSAIDs as he is on a blood thinner.   On reevaluation, the patient is feeling much better.  He reports his pain was relieved with the lidocaine patch and with the Percocet.  Doubt any cauda equina given the patient does not have any back red flag symptoms.  No weakness.  He is ambulatory.  I doubt any kidney etiology or UTI given the reassuring imaging and also lack of urinary symptoms other than some dark urine.  I discussed the lab and imaging results with the patient and family at bedside.  We discussed that this is likely a muscle spasm.  Discussed the muscle relaxer to use and take Tylenol as needed for pain as well.  Recommended following up with his primary care doctor for further evaluation.  We discussed return precautions and red flag symptoms.  Patient verbalizes understanding agrees the plan.  Patient is stable being discharged home in good condition.   Final Clinical Impression(s) / ED Diagnoses Final diagnoses:  Acute right-sided low back pain without sciatica    Rx / DC Orders ED Discharge Orders          Ordered    tiZANidine (ZANAFLEX) 4 MG tablet  Every 6 hours PRN        07/31/22 1545              Achille Rich, PA-C 07/31/22 1547    Jacalyn Lefevre, MD 07/31/22 1550

## 2022-07-31 NOTE — Discharge Instructions (Signed)
You were seen in the emergency department for evaluation of your back pain.  It is too low to be a kidney infection.  Your urine was concentrated so I do recommend that you continue drinking plenty of water to stay well-hydrated.  I think this is a muscle spasm.  For this, I will place you on tizanidine to take as needed.  This medication can make you sleepy, so be caution before operating heavy machinery or driving.  Digitally, you can take 1000 mg of Tylenol every 6 hours as needed for pain.  If you have any urinary, bowel incontinence, fevers, numbness, tingling, weakness, please return to the nearest emergency department for evaluation.  Contact a health care provider if: You have pain that is not relieved with rest or medicine. You have increasing pain going down into your legs or buttocks. Your pain does not improve after 2 weeks. You have pain at night. You lose weight without trying. You have a fever or chills. You develop nausea or vomiting. You develop abdominal pain. Get help right away if: You develop new bowel or bladder control problems. You have unusual weakness or numbness in your arms or legs. You feel faint. These symptoms may represent a serious problem that is an emergency. Do not wait to see if the symptoms will go away. Get medical help right away. Call your local emergency services (911 in the [place] Do not drive yourself to the hospital.

## 2022-08-02 ENCOUNTER — Ambulatory Visit
Admission: EM | Admit: 2022-08-02 | Discharge: 2022-08-02 | Disposition: A | Discharge status: Home / Self Care | Payer: PRIVATE HEALTH INSURANCE

## 2022-08-02 ENCOUNTER — Encounter: Payer: Self-pay | Admitting: Emergency Medicine

## 2022-08-02 DIAGNOSIS — M25562 Pain in left knee: Secondary | ICD-10-CM | POA: Diagnosis not present

## 2022-08-02 DIAGNOSIS — M25561 Pain in right knee: Secondary | ICD-10-CM | POA: Diagnosis not present

## 2022-08-02 DIAGNOSIS — G8929 Other chronic pain: Secondary | ICD-10-CM

## 2022-08-02 MED ORDER — KETOROLAC TROMETHAMINE 30 MG/ML IJ SOLN
15.0000 mg | Freq: Once | INTRAMUSCULAR | Status: AC
  Administered 2022-08-02: 15 mg via INTRAMUSCULAR

## 2022-08-02 NOTE — ED Provider Notes (Signed)
[facility]    CSN: [id number] Arrival date & time: 08/02/22  3716      History   Chief Complaint No chief complaint on file.   HPI Gary Burke is a 61 y.o. male.   Patient presents with wife for bilateral knee pain that he reports is chronic.  He denies any recent accident, fall, trauma, or injury to the knees.  Patient reports "it is time for a cortisone shot."  Endorses arthritis in bilateral knees.  Reports pain is worse with any weightbearing.  Denies any radiation of pain up to the hip or down to the ankle.  No now numbness or tingling in the toes.  No recent falls, weakness, fever, nausea/vomiting.  No locking/popping of the knees, or instability.  No bruising, swelling, redness of the knees.  Has been using the topical Voltaren gel and Tylenol 650 mg "every once in a while" for the pain with minimal relief.   Reports he just moved to [place] from [place] about 10 days ago.  Has yet to establish care with a primary care provider although he does have an appointment coming up.  He also does not have an orthopedic provider.  Medical history significant for history of pulmonary embolism, COPD, type 2 diabetes, and hypertension.  He is on insulin for diabetes as well as apixaban for history of pulmonary embolism.  Denies any recent fall or bleeding.    Past Medical History:  Diagnosis Date   Bipolar disorder (HCC)    Diabetes mellitus without complication (HCC) 04/2016   Hypertension    Pulmonary embolism (HCC)    L lung    Patient Active Problem List   Diagnosis Date Noted   History of colonic polyps 07/05/2016   Abnormal chest CT 07/05/2016   Controlled type 2 diabetes mellitus without complication (HCC) 07/05/2016   Essential hypertension, benign 05/30/2016   History of pulmonary embolism 05/30/2016   Morbid obesity (HCC) 05/30/2016   Chronic obstructive pulmonary disease (HCC) 05/30/2016   Long term current use of anticoagulant therapy  05/30/2016    Past Surgical History:  Procedure Laterality Date   APPENDECTOMY  age 62       Home Medications    Prior to Admission medications   Medication Sig Start Date End Date Taking? Authorizing Provider  apixaban (ELIQUIS) 2.5 MG TABS tablet Take by mouth 2 (two) times daily.   Yes [provider]  insulin glargine (LANTUS) 100 UNIT/ML injection Inject 100 Units into the skin daily.   Yes [provider]  Semaglutide (OZEMPIC, 1 MG/DOSE, ) Inject 1 1e11 Vector Genomes into the skin.   Yes [provider]  albuterol (PROVENTIL HFA;VENTOLIN HFA) 108 (90 Base) MCG/ACT inhaler Inhale 1-2 puffs into the lungs every 6 (six) hours as needed for wheezing or shortness of breath. 10/01/16   Jacquelin Hawking, PA-C  albuterol (PROVENTIL) (2.5 MG/3ML) 0.083% nebulizer solution Take 3 mLs (2.5 mg total) by nebulization every 6 (six) hours as needed for wheezing or shortness of breath. 09/24/16   Jacquelin Hawking, PA-C  aspirin 81 MG tablet Take 81 mg by mouth daily.    [provider]  glipiZIDE (GLUCOTROL) 5 MG tablet Take 1 tablet (5 mg total) by mouth 2 (two) times daily before a meal. Patient taking differently: Take 5 mg by mouth daily.  09/24/16   Jacquelin Hawking, PA-C  metoprolol tartrate (LOPRESSOR) 25 MG tablet Take 1 tablet (25 mg total) by mouth 2 (two) times daily. 11/05/16  Soyla Dryer, PA-C  rivaroxaban (XARELTO) 20 MG TABS tablet Take 20 mg by mouth daily.     [provider]  tiZANidine (ZANAFLEX) 4 MG tablet Take 1 tablet (4 mg total) by mouth every 6 (six) hours as needed for muscle spasms. 07/31/22   Sherrell Puller, PA-C    Family History Family History  Problem Relation Age of Onset   Diabetes Daughter     Social History Social History   Tobacco Use   Smoking status: Former    Packs/day: 0.50    Years: 37.00    Total pack years: 18.50    Types: Cigarettes    Quit date: 10/29/2014    Years since quitting: 7.7    Smokeless tobacco: Never  Substance Use Topics   Alcohol use: No   Drug use: No     Allergies   Penicillins   Review of Systems Review of Systems Per HPI  Physical Exam Triage Vital Signs ED Triage Vitals  Enc Vitals Group     BP 08/02/22 0959 125/88     Pulse Rate 08/02/22 0959 89     Resp 08/02/22 0959 16     Temp 08/02/22 0959 97.8 F (36.6 C)     Temp Source 08/02/22 0959 Oral     SpO2 08/02/22 0959 96 %     Weight --      Height --      Head Circumference --      Peak Flow --      Pain Score 08/02/22 1000 8     Pain Loc --      Pain Edu? --      Excl. in [place]? --    No data found.  Updated Vital Signs BP 125/88 (BP Location: Right Arm)   Pulse 89   Temp 97.8 F (36.6 C) (Oral)   Resp 16   SpO2 96%   Visual Acuity Right Eye Distance:   Left Eye Distance:   Bilateral Distance:    Right Eye Near:   Left Eye Near:    Bilateral Near:     Physical Exam Vitals and nursing note reviewed.  Constitutional:      General: He is not in acute distress.    Appearance: Normal appearance. He is not toxic-appearing.  Pulmonary:     Effort: Pulmonary effort is normal. No respiratory distress.  Musculoskeletal:     Right knee: Bony tenderness present. No effusion. Normal range of motion.     Left knee: Effusion and bony tenderness present. Normal range of motion.     Right lower leg: No edema.     Left lower leg: No edema.     Comments: Inspection: No obvious swelling, deformity, or redness to bilateral knees Palpation: Bilateral knee joints tender to palpation diffusely;  no obvious deformities palpated ROM: Full passive ROM to knee, although slightly painful Strength: 5/5 bilateral lower extremities Neurovascular: neurovascularly intact in left and right lower extremity    Skin:    General: Skin is warm and dry.     Capillary Refill: Capillary refill takes less than 2 seconds.     Coloration: Skin is not jaundiced or pale.     Findings: No erythema.   Neurological:     Mental Status: He is alert and oriented to person, place, and time.  Psychiatric:        Behavior: Behavior is cooperative.      UC Treatments / Results  Labs (all labs ordered are listed, but  only abnormal results are displayed) Labs Reviewed - No data to display  EKG   Radiology   Procedures Procedures (including critical care time)  Medications Ordered in UC Medications  ketorolac (TORADOL) 30 MG/ML injection 15 mg (15 mg Intramuscular Given 08/02/22 1033)    Initial Impression / Assessment and Plan / UC Course  I have reviewed the triage vital signs and the nursing notes.  Pertinent labs & imaging results that were available during my care of the patient were reviewed by me and considered in my medical decision making (see chart for details).    Patient is well-appearing, normotensive, afebrile, not tachycardic, not tachypneic, oxygenating well on room air.   Pain is chronic in nature.  I discussed with patient that we do not do cortisone shots in joints in urgent care setting.  Recommended close follow-up with orthopedic provider and contact information given.  In the meantime, continue Tylenol 500 to 1000 mg as well as Voltaren gel.  Patient requesting tramadol prescription, discussed with patient that I will not be prescribing this today.  Discussed with patient risk vs. benefit of low-dose Toradol injection given daily Eliquis use and low risk of bleeding.  Patient agreeable to the low-dose Toradol injection.  Recommended closely monitoring for any bleeding within the next couple of days.  The patient was given the opportunity to ask questions.  ER and return precautions discussed.  All questions answered to their satisfaction.  The patient is in agreement to this plan.    Final Clinical Impressions(s) / UC Diagnoses   Final diagnoses:  Chronic pain of both knees     Discharge Instructions      We have given you a low-dose shot of Toradol today  for the knee pain which is a strong anti-inflammatory.  Please watch out for any bleeding the next couple of days as there is low risk for this with the Eliquis.  Continue Tylenol 500 to 1000 mg every 12 hours as needed for pain along with topical Voltaren gel.  Regarding the chronic knee pain, please follow-up with an orthopedic provider-contact information is below.  Make sure you establish care with a primary care provider as soon as you can for a formal referral if you need it for your insurance.    ED Prescriptions   None    I have reviewed the PDMP during this encounter.   Valentino Nose, NP 08/02/22 1053

## 2022-08-02 NOTE — ED Triage Notes (Signed)
Bilateral knee pain that started yesterday.  States he has arthritis in both knees.  Hurts worse when standing

## 2022-08-02 NOTE — Discharge Instructions (Addendum)
We have given you a low-dose shot of Toradol today for the knee pain which is a strong anti-inflammatory.  Please watch out for any bleeding the next couple of days as there is low risk for this with the Eliquis.  Continue Tylenol 500 to 1000 mg every 12 hours as needed for pain along with topical Voltaren gel.  Regarding the chronic knee pain, please follow-up with an orthopedic provider-contact information is below.  Make sure you establish care with a primary care provider as soon as you can for a formal referral if you need it for your insurance.

## 2022-08-05 ENCOUNTER — Encounter (HOSPITAL_COMMUNITY): Payer: Self-pay

## 2022-08-05 ENCOUNTER — Other Ambulatory Visit: Payer: Self-pay

## 2022-08-05 ENCOUNTER — Emergency Department (HOSPITAL_COMMUNITY): Payer: Medicaid Other

## 2022-08-05 ENCOUNTER — Emergency Department (HOSPITAL_COMMUNITY)
Admission: EM | Admit: 2022-08-05 | Discharge: 2022-08-05 | Disposition: A | Discharge status: Home / Self Care | Payer: Medicaid Other | Attending: Emergency Medicine | Admitting: Emergency Medicine

## 2022-08-05 DIAGNOSIS — M170 Bilateral primary osteoarthritis of knee: Secondary | ICD-10-CM | POA: Insufficient documentation

## 2022-08-05 DIAGNOSIS — Z794 Long term (current) use of insulin: Secondary | ICD-10-CM | POA: Insufficient documentation

## 2022-08-05 DIAGNOSIS — M25562 Pain in left knee: Secondary | ICD-10-CM | POA: Diagnosis present

## 2022-08-05 DIAGNOSIS — Z7901 Long term (current) use of anticoagulants: Secondary | ICD-10-CM | POA: Insufficient documentation

## 2022-08-05 DIAGNOSIS — M25561 Pain in right knee: Secondary | ICD-10-CM | POA: Insufficient documentation

## 2022-08-05 DIAGNOSIS — E119 Type 2 diabetes mellitus without complications: Secondary | ICD-10-CM | POA: Diagnosis not present

## 2022-08-05 DIAGNOSIS — Z7984 Long term (current) use of oral hypoglycemic drugs: Secondary | ICD-10-CM | POA: Insufficient documentation

## 2022-08-05 DIAGNOSIS — Z79899 Other long term (current) drug therapy: Secondary | ICD-10-CM | POA: Diagnosis not present

## 2022-08-05 MED ORDER — DICLOFENAC SODIUM 1 % EX GEL
4.0000 g | Freq: Once | CUTANEOUS | Status: AC
  Administered 2022-08-05: 4 g via TOPICAL
  Filled 2022-08-05: qty 100

## 2022-08-05 MED ORDER — TRAMADOL HCL 50 MG PO TABS
50.0000 mg | ORAL_TABLET | Freq: Once | ORAL | Status: AC
  Administered 2022-08-05: 50 mg via ORAL
  Filled 2022-08-05: qty 1

## 2022-08-05 MED ORDER — DICLOFENAC SODIUM 1 % EX GEL: 4.0000 g | Freq: Four times a day (QID) | CUTANEOUS | 0 refills | Status: DC

## 2022-08-05 NOTE — Discharge Instructions (Addendum)
Please follow-up with your primary care doctor for your chronic knee pain.  You can use diclofenac gel up to 4 times a day, there will be a dosing card on there to help you with the amount that you should put on each knee.  Please rest, elevate your legs, and use ice as needed.  Return to the ER if you have redness, swelling of the legs.

## 2022-08-05 NOTE — ED Provider Notes (Signed)
[facility] EMERGENCY DEPARTMENT Provider Note   CSN: [id number] Arrival date & time: 08/05/22  1254     History  Chief Complaint  Patient presents with   Knee Pain    Gary Burke is a 61 y.o. male, history of diabetes, osteoarthritis, who presents to the ED secondary to bilateral knee pain for the last day.  He states the outsides of both of his knees hurt, denies any trauma to them.  Has history of bilateral osteoarthritis, states he used to see someone in [place], but just moved here and is out of his tramadol.  He states that the pain started about a day ago and has gotten worse.  Has not tried anything for the pain.  Has had injections in the joints in the past, which she states has brought him some relief.  Denies any redness or swelling of his knees.     Home Medications Prior to Admission medications   Medication Sig Start Date End Date Taking? Authorizing Provider  albuterol (PROVENTIL HFA;VENTOLIN HFA) 108 (90 Base) MCG/ACT inhaler Inhale 1-2 puffs into the lungs every 6 (six) hours as needed for wheezing or shortness of breath. 10/01/16  Yes Soyla Dryer, PA-C  albuterol (PROVENTIL) (2.5 MG/3ML) 0.083% nebulizer solution Take 3 mLs (2.5 mg total) by nebulization every 6 (six) hours as needed for wheezing or shortness of breath. 09/24/16  Yes Soyla Dryer, PA-C  apixaban (ELIQUIS) 2.5 MG TABS tablet Take by mouth 2 (two) times daily.   Yes [provider]  atorvastatin (LIPITOR) 40 MG tablet Take 1 tablet by mouth daily. 04/02/22  Yes [provider]  cyclobenzaprine (FLEXERIL) 10 MG tablet Take 10 mg by mouth 3 (three) times daily as needed for muscle spasms.   Yes [provider]  diclofenac Sodium (VOLTAREN) 1 % GEL Apply 4 g topically 4 (four) times daily. 08/05/22  Yes Summar Mcglothlin L, PA  fluticasone-salmeterol (ADVAIR) 500-50 MCG/ACT AEPB Inhale into the lungs. 04/12/22  Yes [provider]  guaiFENesin (MUCINEX) 600 MG 12 hr  tablet Take 600 mg by mouth every 12 (twelve) hours. 04/16/22  Yes [provider]  insulin glargine (LANTUS) 100 UNIT/ML injection Inject 100 Units into the skin daily.   Yes [provider]  meclizine (ANTIVERT) 25 MG tablet Take 25 mg by mouth 3 (three) times daily as needed for dizziness or nausea. 07/11/22  Yes [provider]  metoprolol tartrate (LOPRESSOR) 25 MG tablet Take 1 tablet (25 mg total) by mouth 2 (two) times daily. Patient taking differently: Take 50 mg by mouth at bedtime. 11/05/16  Yes McElroy, Larene [place], PA-C  Semaglutide (OZEMPIC, 1 MG/DOSE, [place]) Inject 1 1e11 Vector Genomes into the skin See admin instructions. Every Saturday   Yes [provider]  tiZANidine (ZANAFLEX) 4 MG tablet Take 1 tablet (4 mg total) by mouth every 6 (six) hours as needed for muscle spasms. 07/31/22   Sherrell Puller, PA-C  traMADol (ULTRAM) 50 MG tablet Take by mouth. Patient not taking: Reported on 08/05/2022 07/11/22   [provider]      Allergies    Penicillins    Review of Systems   Review of Systems  Musculoskeletal:  Negative for joint swelling.       +Bilateral knee pain  Skin:  Negative for rash.    Physical Exam Updated Vital Signs BP (!) 141/93 (BP Location: Right Arm)   Pulse (!) 106   Temp 97.7 F (36.5 C) (Oral)   Resp 20  Ht 6' (1.829 m)   Wt 127.9 kg   SpO2 96%   BMI 38.25 kg/m  Physical Exam Vitals and nursing note reviewed.  Constitutional:      General: He is not in acute distress.    Appearance: He is well-developed.  HENT:     Head: Normocephalic and atraumatic.  Eyes:     General:        Right eye: No discharge.        Left eye: No discharge.     Conjunctiva/sclera: Conjunctivae normal.  Pulmonary:     Effort: No respiratory distress.  Musculoskeletal:     Comments: Bilateral Knees: Tenderness to palpation of lateral joint lines of both knees. An effusion is not present.  Negative anterior and posterior drawer.  Negative Mcmurray's. +Patellar stability. Negative valgus and varus stress test.. Extension and flexion intact. No sensory deficits.    Neurological:     General: No focal deficit present.     Mental Status: He is alert.     Sensory: No sensory deficit.     Motor: No weakness.     Gait: Gait normal.     Comments: Clear speech.   Psychiatric:        Behavior: Behavior normal.        Thought Content: Thought content normal.     ED Results / Procedures / Treatments   Labs (all labs ordered are listed, but only abnormal results are displayed) Labs Reviewed - No data to display  EKG None  Radiology DG Knee Complete 4 Views Right  Result Date: 08/05/2022 CLINICAL DATA:  Bilateral knee pain x1 day EXAM: RIGHT KNEE - COMPLETE 4 VIEW; LEFT KNEE - COMPLETE 4 VIEW COMPARISON:  None Available. FINDINGS: Right knee: No evidence of fracture, dislocation, or joint effusion. Mild tricompartment osteophytosis. Scattered calcified atherosclerosis. Left knee: No evidence of fracture, dislocation, or joint effusion. Moderate tricompartment osteophytosis. Scattered calcified atherosclerosis. IMPRESSION: Mild left-greater-than-right knee osteoarthritis. Electronically Signed   By: Beryle Flock M.D.   On: 08/05/2022 15:28   DG Knee Complete 4 Views Left  Result Date: 08/05/2022 CLINICAL DATA:  Bilateral knee pain x1 day EXAM: RIGHT KNEE - COMPLETE 4 VIEW; LEFT KNEE - COMPLETE 4 VIEW COMPARISON:  None Available. FINDINGS: Right knee: No evidence of fracture, dislocation, or joint effusion. Mild tricompartment osteophytosis. Scattered calcified atherosclerosis. Left knee: No evidence of fracture, dislocation, or joint effusion. Moderate tricompartment osteophytosis. Scattered calcified atherosclerosis. IMPRESSION: Mild left-greater-than-right knee osteoarthritis. Electronically Signed   By: Beryle Flock M.D.   On: 08/05/2022 15:28   Medications Ordered in ED Medications  traMADol (ULTRAM) tablet 50  mg (50 mg Oral Given 08/05/22 1411)  diclofenac Sodium (VOLTAREN) 1 % topical gel 4 g (4 g Topical Given 08/05/22 1417)    ED Course/ Medical Decision Making/ A&P                           Medical Decision Making Amount and/or Complexity of Data Reviewed Radiology: ordered.  Risk Prescription drug management.  This patient presents to the ED for concern of bilateral knee pain  Imaging Studies ordered:  I ordered imaging studies including bilateral knee xrays which show mild osteoarthritis bilaterally    Problem List / ED Course / Critical interventions / Medication management  I ordered medication including diclofenac gel and tramadol  for pain   Reevaluation of the patient after these medicines showed that the patient improved I have reviewed  the patients home medicines and have made adjustments as needed   Test / Admission - Considered:  Patient is a 61 year old male, here for bilateral knee pain has a history of chronic knee pain, and osteoarthritis, and last had a injection about 2 months ago in his knees, which helped.  Pain has been going for 1 day, worse with walking.  Has mild tenderness to palpation of lateral joint lines of bilateral knees.  Given diclofenac, and tramadol per her request with improvement of symptoms.  X-rays significant for mild osteoarthritis.  Discussed care at home, discharged with diclofenac gel, he will need to follow-up with his primary care or pain management if he would like to have controlled substances for his chronic pain. Final Clinical Impression(s) / ED Diagnoses Final diagnoses:  Localized osteoarthritis of knees, bilateral    Rx / DC Orders ED Discharge Orders          Ordered    diclofenac Sodium (VOLTAREN) 1 % GEL  4 times daily        08/05/22 1553              Joss Friedel L, PA [id number]    Campbell Stall P, DO [id number]

## 2022-08-05 NOTE — ED Triage Notes (Signed)
Bilateral knee pain. Started 1 day ago. Pt reports  having a brace but "it fell off."

## 2022-08-30 ENCOUNTER — Encounter (HOSPITAL_COMMUNITY): Payer: Self-pay | Admitting: Emergency Medicine

## 2022-08-30 ENCOUNTER — Emergency Department (HOSPITAL_COMMUNITY)
Admission: EM | Admit: 2022-08-30 | Discharge: 2022-08-30 | Disposition: A | Discharge status: Home / Self Care | Payer: Medicaid Other | Attending: Emergency Medicine | Admitting: Emergency Medicine

## 2022-08-30 ENCOUNTER — Other Ambulatory Visit: Payer: Self-pay

## 2022-08-30 DIAGNOSIS — G8929 Other chronic pain: Secondary | ICD-10-CM

## 2022-08-30 DIAGNOSIS — Z794 Long term (current) use of insulin: Secondary | ICD-10-CM | POA: Diagnosis not present

## 2022-08-30 DIAGNOSIS — M25561 Pain in right knee: Secondary | ICD-10-CM | POA: Insufficient documentation

## 2022-08-30 DIAGNOSIS — M25562 Pain in left knee: Secondary | ICD-10-CM | POA: Insufficient documentation

## 2022-08-30 DIAGNOSIS — Z7901 Long term (current) use of anticoagulants: Secondary | ICD-10-CM | POA: Diagnosis not present

## 2022-08-30 MED ORDER — TRAMADOL HCL 50 MG PO TABS
50.0000 mg | ORAL_TABLET | Freq: Once | ORAL | Status: AC
  Administered 2022-08-30: 50 mg via ORAL
  Filled 2022-08-30: qty 1

## 2022-08-30 MED ORDER — KETOROLAC TROMETHAMINE 15 MG/ML IJ SOLN
15.0000 mg | Freq: Once | INTRAMUSCULAR | Status: AC
  Administered 2022-08-30: 15 mg via INTRAMUSCULAR
  Filled 2022-08-30: qty 1

## 2022-08-30 NOTE — ED Provider Notes (Signed)
[facility] EMERGENCY DEPARTMENT Provider Note   CSN: [id number] Arrival date & time: 08/30/22  1202     History Chief Complaint  Patient presents with   Knee Pain    Gary Burke is a 61 y.o. male patient who presents to the emergency department today for further evaluation of bilateral knee pain that is chronic secondary to arthritis.  He states that the right knee hurts worse than the left.  He was seen here in the ED 1 month ago per chart review for similar symptoms.  He is already been evaluated by orthopedics and is in the process of getting a knee replacement.  He denies any new injury, fever, swelling, or trauma to the knee.  Here for pain control.   Knee Pain      Home Medications Prior to Admission medications   Medication Sig Start Date End Date Taking? Authorizing Provider  albuterol (PROVENTIL HFA;VENTOLIN HFA) 108 (90 Base) MCG/ACT inhaler Inhale 1-2 puffs into the lungs every 6 (six) hours as needed for wheezing or shortness of breath. 10/01/16   Jacquelin Hawking, PA-C  albuterol (PROVENTIL) (2.5 MG/3ML) 0.083% nebulizer solution Take 3 mLs (2.5 mg total) by nebulization every 6 (six) hours as needed for wheezing or shortness of breath. 09/24/16   Jacquelin Hawking, PA-C  apixaban (ELIQUIS) 2.5 MG TABS tablet Take by mouth 2 (two) times daily.    [provider]  atorvastatin (LIPITOR) 40 MG tablet Take 1 tablet by mouth daily. 04/02/22   [provider]  cyclobenzaprine (FLEXERIL) 10 MG tablet Take 10 mg by mouth 3 (three) times daily as needed for muscle spasms.    [provider]  diclofenac Sodium (VOLTAREN) 1 % GEL Apply 4 g topically 4 (four) times daily. 08/05/22   Small, Brooke L, PA  fluticasone-salmeterol (ADVAIR) 500-50 MCG/ACT AEPB Inhale into the lungs. 04/12/22   [provider]  guaiFENesin (MUCINEX) 600 MG 12 hr tablet Take 600 mg by mouth every 12 (twelve) hours. 04/16/22   [provider]  insulin glargine  (LANTUS) 100 UNIT/ML injection Inject 100 Units into the skin daily.    [provider]  meclizine (ANTIVERT) 25 MG tablet Take 25 mg by mouth 3 (three) times daily as needed for dizziness or nausea. 07/11/22   [provider]  metoprolol tartrate (LOPRESSOR) 25 MG tablet Take 1 tablet (25 mg total) by mouth 2 (two) times daily. Patient taking differently: Take 50 mg by mouth at bedtime. 11/05/16   Jacquelin Hawking, PA-C  Semaglutide (OZEMPIC, 1 MG/DOSE, [place]) Inject 1 1e11 Vector Genomes into the skin See admin instructions. Every Saturday    [provider]  tiZANidine (ZANAFLEX) 4 MG tablet Take 1 tablet (4 mg total) by mouth every 6 (six) hours as needed for muscle spasms. 07/31/22   Achille Rich, PA-C  traMADol (ULTRAM) 50 MG tablet Take by mouth. Patient not taking: Reported on 08/05/2022 07/11/22   [provider]      Allergies    Penicillins    Review of Systems   Review of Systems  All other systems reviewed and are negative.   Physical Exam Updated Vital Signs BP (!) 131/96 (BP Location: Right Arm)   Pulse 93   Temp 97.6 F (36.4 C) (Oral)   Resp 20   SpO2 97%  Physical Exam Vitals and nursing note reviewed.  Constitutional:      Appearance: Normal appearance.  HENT:     Head: Normocephalic and atraumatic.  Eyes:  General:        Right eye: No discharge.        Left eye: No discharge.     Conjunctiva/sclera: Conjunctivae normal.  Pulmonary:     Effort: Pulmonary effort is normal.  Musculoskeletal:     Comments: Knees bilaterally are in normal appearance.  Good range of motion.  No significant point tenderness over the knees bilaterally.  DP pulses are 2+ bilaterally  Skin:    General: Skin is warm and dry.     Findings: No rash.  Neurological:     General: No focal deficit present.     Mental Status: He is alert.  Psychiatric:        Mood and Affect: Mood normal.        Behavior: Behavior normal.     ED Results /  Procedures / Treatments   Labs (all labs ordered are listed, but only abnormal results are displayed) Labs Reviewed - No data to display  EKG None  Radiology No results found.  Procedures Procedures    Medications Ordered in ED Medications  ketorolac (TORADOL) 15 MG/ML injection 15 mg (15 mg Intramuscular Given 08/30/22 1258)  traMADol (ULTRAM) tablet 50 mg (50 mg Oral Given 08/30/22 1258)    ED Course/ Medical Decision Making/ A&P Clinical Course as of 08/30/22 1348  Thu Aug 30, 2022  1347 Patient is feeling better after Toradol and tramadol.  I instructed him to follow-up with orthopedics. [CF]    Clinical Course User Index [CF] Hendricks Limes, PA-C                           Medical Decision Making Gary Burke is a 61 y.o. male patient who presents to the emergency department today for further evaluation of bilateral knee pain.  I reviewed his chart and it seems that he was here roughly 1 month ago and was seen in urgent care 1 month ago for similar symptoms.  He was given tramadol and a Toradol shot.  I will repeat this today.  I do not feel that imaging is warranted considering that this is a chronic problem with no new acute features.  Have a low suspicion at this time for septic joint.  I will reassess after pain meds were administered.   He will follow-up with his orthopedist for further evaluation.  Strict return precautions were discussed.  He is safe for discharge.  Amount and/or Complexity of Data Reviewed External Data Reviewed: notes.  Risk Prescription drug management.   Final Clinical Impression(s) / ED Diagnoses Final diagnoses:  Chronic pain of both knees    Rx / DC Orders ED Discharge Orders     None         Cherrie Gauze 08/30/22 1349    Davonna Belling, MD 08/30/22 1535

## 2022-08-30 NOTE — ED Notes (Signed)
Dc instructions reviewed with pt no questions or concerns at this time.  

## 2022-08-30 NOTE — ED Triage Notes (Signed)
Pt c/o chronic pain in knees. Has taken the limit of tylenol for it but still in pain. Nad. ambulatory

## 2022-08-30 NOTE — Discharge Instructions (Addendum)
Please follow-up with your orthopedist for further evaluation.  As we discussed, you probably need to discuss moving forward with a knee replacement.  Please return to the emergency room for any worsening symptoms you might have.

## 2022-09-13 ENCOUNTER — Emergency Department (HOSPITAL_COMMUNITY)
Admission: EM | Admit: 2022-09-13 | Discharge: 2022-09-13 | Disposition: A | Discharge status: Home / Self Care | Payer: Medicaid Other | Attending: Emergency Medicine | Admitting: Emergency Medicine

## 2022-09-13 ENCOUNTER — Other Ambulatory Visit: Payer: Self-pay

## 2022-09-13 ENCOUNTER — Encounter (HOSPITAL_COMMUNITY): Payer: Self-pay

## 2022-09-13 DIAGNOSIS — Z7901 Long term (current) use of anticoagulants: Secondary | ICD-10-CM | POA: Diagnosis not present

## 2022-09-13 DIAGNOSIS — G8929 Other chronic pain: Secondary | ICD-10-CM | POA: Insufficient documentation

## 2022-09-13 DIAGNOSIS — Z79899 Other long term (current) drug therapy: Secondary | ICD-10-CM | POA: Diagnosis not present

## 2022-09-13 DIAGNOSIS — M25561 Pain in right knee: Secondary | ICD-10-CM | POA: Insufficient documentation

## 2022-09-13 DIAGNOSIS — Z794 Long term (current) use of insulin: Secondary | ICD-10-CM | POA: Insufficient documentation

## 2022-09-13 MED ORDER — KETOROLAC TROMETHAMINE 15 MG/ML IJ SOLN
15.0000 mg | Freq: Once | INTRAMUSCULAR | Status: AC
  Administered 2022-09-13: 15 mg via INTRAMUSCULAR
  Filled 2022-09-13: qty 1

## 2022-09-13 NOTE — ED Triage Notes (Signed)
Pov from home. Cc of chronic right knee pain. Says he usually gets a shot that helps.

## 2022-09-13 NOTE — ED Provider Notes (Signed)
[facility] EMERGENCY DEPARTMENT Provider Note   CSN: [id number] Arrival date & time: 09/13/22  1933     History  Chief Complaint  Patient presents with   Knee Pain    Gary Burke is a 61 y.o. male presenting with right knee pain.  This is a chronic problem.  Reports that he has not been scheduled with orthopedics for a knee replacement that he hopes to have.  Says that he gets a Toradol shot which always helps.   Knee Pain      Home Medications Prior to Admission medications   Medication Sig Start Date End Date Taking? Authorizing Provider  albuterol (PROVENTIL HFA;VENTOLIN HFA) 108 (90 Base) MCG/ACT inhaler Inhale 1-2 puffs into the lungs every 6 (six) hours as needed for wheezing or shortness of breath. 10/01/16   Jacquelin Hawking, PA-C  albuterol (PROVENTIL) (2.5 MG/3ML) 0.083% nebulizer solution Take 3 mLs (2.5 mg total) by nebulization every 6 (six) hours as needed for wheezing or shortness of breath. 09/24/16   Jacquelin Hawking, PA-C  apixaban (ELIQUIS) 2.5 MG TABS tablet Take by mouth 2 (two) times daily.    [provider]  atorvastatin (LIPITOR) 40 MG tablet Take 1 tablet by mouth daily. 04/02/22   [provider]  cyclobenzaprine (FLEXERIL) 10 MG tablet Take 10 mg by mouth 3 (three) times daily as needed for muscle spasms.    [provider]  diclofenac Sodium (VOLTAREN) 1 % GEL Apply 4 g topically 4 (four) times daily. 08/05/22   Small, Brooke L, PA  fluticasone-salmeterol (ADVAIR) 500-50 MCG/ACT AEPB Inhale into the lungs. 04/12/22   [provider]  guaiFENesin (MUCINEX) 600 MG 12 hr tablet Take 600 mg by mouth every 12 (twelve) hours. 04/16/22   [provider]  insulin glargine (LANTUS) 100 UNIT/ML injection Inject 100 Units into the skin daily.    [provider]  meclizine (ANTIVERT) 25 MG tablet Take 25 mg by mouth 3 (three) times daily as needed for dizziness or nausea. 07/11/22   [provider]   metoprolol tartrate (LOPRESSOR) 25 MG tablet Take 1 tablet (25 mg total) by mouth 2 (two) times daily. Patient taking differently: Take 50 mg by mouth at bedtime. 11/05/16   Jacquelin Hawking, PA-C  Semaglutide (OZEMPIC, 1 MG/DOSE, [place]) Inject 1 1e11 Vector Genomes into the skin See admin instructions. Every Saturday    [provider]  tiZANidine (ZANAFLEX) 4 MG tablet Take 1 tablet (4 mg total) by mouth every 6 (six) hours as needed for muscle spasms. 07/31/22   Achille Rich, PA-C  traMADol (ULTRAM) 50 MG tablet Take by mouth. Patient not taking: Reported on 08/05/2022 07/11/22   [provider]      Allergies    Penicillins    Review of Systems   Review of Systems  Physical Exam Updated Vital Signs BP (!) 142/102 (BP Location: Right Arm)   Pulse 88   Temp 97.8 F (36.6 C) (Oral)   Resp 20   Ht 6' (1.829 m)   Wt 128 kg   SpO2 97%   BMI 38.27 kg/m  Physical Exam Vitals and nursing note reviewed.  Constitutional:      Appearance: Normal appearance.  HENT:     Head: Normocephalic and atraumatic.  Eyes:     General: No scleral icterus.    Conjunctiva/sclera: Conjunctivae normal.  Pulmonary:     Effort: Pulmonary effort is normal. No respiratory distress.  Musculoskeletal:     Comments: Full range of  motion of bilateral lower extremities.  Strong DP pulses bilaterally.  No obvious deformities, negative ligamental testing.  Ambulatory.  Skin:    Findings: No rash.  Neurological:     Mental Status: He is alert.  Psychiatric:        Mood and Affect: Mood normal.     ED Results / Procedures / Treatments   Labs (all labs ordered are listed, but only abnormal results are displayed) Labs Reviewed - No data to display  EKG None  Radiology No results found.  Procedures Procedures   Medications Ordered in ED Medications  ketorolac (TORADOL) 15 MG/ML injection 15 mg (has no administration in time range)  ketorolac (TORADOL) 15 MG/ML injection 15 mg  (has no administration in time range)    ED Course/ Medical Decision Making/ A&P                           Medical Decision Making Risk Prescription drug management.   61 year old male with chronic knee pain presenting with knee pain.  Per chart review he has had 5 visits for this in the past month.  Usually treated with tramadol and Toradol.  I am agreeable to giving Toradol shot, but will hold any opioid treatment.  Ultimately, he needs better outpatient care for his chronic problem.  I discussed this with the patient and his partner.  Toradol shot given and patient discharged with encouragement to follow-up with PCP and orthopedics.   Final Clinical Impression(s) / ED Diagnoses Final diagnoses:  Chronic pain of right knee    Rx / DC Orders ED Discharge Orders     None      Results and diagnoses were explained to the patient. Return precautions discussed in full. Patient had no additional questions and expressed complete understanding.   This chart was dictated using voice recognition software.  Despite best efforts to proofread,  errors can occur which can change the documentation meaning.    Woodroe Chen 09/13/22 2045    Jacalyn Lefevre, MD 09/14/22 [phone]

## 2022-09-13 NOTE — Discharge Instructions (Signed)
Please call your primary care doctor and orthopedist for better outpatient pain control.

## 2022-10-11 ENCOUNTER — Ambulatory Visit: Payer: Self-pay | Admitting: Physician Assistant

## 2022-10-16 ENCOUNTER — Ambulatory Visit: Payer: Self-pay | Admitting: Physician Assistant

## 2022-10-16 ENCOUNTER — Encounter: Payer: Self-pay | Admitting: Physician Assistant

## 2022-10-16 ENCOUNTER — Telehealth: Payer: Self-pay

## 2022-10-16 VITALS — BP 119/72 | HR 86 | Temp 97.8°F | Ht 71.5 in | Wt 253.0 lb

## 2022-10-16 DIAGNOSIS — J8410 Pulmonary fibrosis, unspecified: Secondary | ICD-10-CM

## 2022-10-16 DIAGNOSIS — Z7901 Long term (current) use of anticoagulants: Secondary | ICD-10-CM

## 2022-10-16 DIAGNOSIS — J449 Chronic obstructive pulmonary disease, unspecified: Secondary | ICD-10-CM

## 2022-10-16 DIAGNOSIS — Z86010 Personal history of colonic polyps: Secondary | ICD-10-CM

## 2022-10-16 DIAGNOSIS — Z7689 Persons encountering health services in other specified circumstances: Secondary | ICD-10-CM

## 2022-10-16 DIAGNOSIS — E1165 Type 2 diabetes mellitus with hyperglycemia: Secondary | ICD-10-CM

## 2022-10-16 DIAGNOSIS — M1711 Unilateral primary osteoarthritis, right knee: Secondary | ICD-10-CM

## 2022-10-16 DIAGNOSIS — Z125 Encounter for screening for malignant neoplasm of prostate: Secondary | ICD-10-CM

## 2022-10-16 DIAGNOSIS — I10 Essential (primary) hypertension: Secondary | ICD-10-CM

## 2022-10-16 DIAGNOSIS — Z86711 Personal history of pulmonary embolism: Secondary | ICD-10-CM

## 2022-10-16 DIAGNOSIS — Z23 Encounter for immunization: Secondary | ICD-10-CM

## 2022-10-16 DIAGNOSIS — R9389 Abnormal findings on diagnostic imaging of other specified body structures: Secondary | ICD-10-CM

## 2022-10-16 MED ORDER — APIXABAN 5 MG PO TABS: 5.0000 mg | ORAL_TABLET | Freq: Two times a day (BID) | ORAL | 3 refills | Status: DC

## 2022-10-16 MED ORDER — METOPROLOL TARTRATE 25 MG PO TABS: 25.0000 mg | ORAL_TABLET | Freq: Two times a day (BID) | ORAL | 0 refills | Status: AC

## 2022-10-16 MED ORDER — ATORVASTATIN CALCIUM 40 MG PO TABS: 40.0000 mg | ORAL_TABLET | Freq: Every day | ORAL | 0 refills | Status: AC

## 2022-10-16 MED ORDER — LOSARTAN POTASSIUM 25 MG PO TABS: 25.0000 mg | ORAL_TABLET | Freq: Every day | ORAL | 0 refills | Status: AC

## 2022-10-16 MED ORDER — ALBUTEROL SULFATE HFA 108 (90 BASE) MCG/ACT IN AERS: 1.0000 | INHALATION_SPRAY | Freq: Four times a day (QID) | RESPIRATORY_TRACT | 0 refills | Status: AC | PRN

## 2022-10-16 MED ORDER — TAMSULOSIN HCL 0.4 MG PO CAPS: 0.4000 mg | ORAL_CAPSULE | Freq: Every day | ORAL | 4 refills | Status: AC

## 2022-10-16 MED ORDER — FLUTICASONE-SALMETEROL 500-50 MCG/ACT IN AEPB: 1.0000 | INHALATION_SPRAY | Freq: Two times a day (BID) | RESPIRATORY_TRACT | 0 refills | Status: AC

## 2022-10-16 MED ORDER — ALBUTEROL SULFATE (2.5 MG/3ML) 0.083% IN NEBU: 2.5000 mg | INHALATION_SOLUTION | Freq: Four times a day (QID) | RESPIRATORY_TRACT | 0 refills | Status: AC | PRN

## 2022-10-16 MED ORDER — CYCLOBENZAPRINE HCL 5 MG PO TABS: 5.0000 mg | ORAL_TABLET | Freq: Three times a day (TID) | ORAL | 0 refills | Status: DC | PRN

## 2022-10-16 NOTE — Progress Notes (Signed)
BP 119/72   Pulse 86   Temp 97.8 F (36.6 C)   Ht 5' 11.5" (1.816 m)   Wt 253 lb (114.8 kg)   SpO2 97%   BMI 34.79 kg/m    Subjective:    Patient ID: Gary Burke, male    DOB: June 16, 1961, 61 y.o.   MRN: [id number]  HPI: Gary Burke is a 61 y.o. male presenting on 10/16/2022 for New Patient (Initial Visit) (Pt was an [facility] pt in the past, but stopped coming due to moving to [facility]. Pt states he moved back in Sept. Pt saw Dr. Metta Clines, but that Dr is out for hip surgery. Pt states he wishes to one day go back to him when he returns. Pt is here for continuation of care. )   HPI  Chief Complaint  Patient presents with   New Patient (Initial Visit)    Pt was an [facility] pt in the past, but stopped coming due to moving to [place]. Pt states he moved back in Sept. Pt saw Dr. Metta Clines, but that Dr is out for hip surgery. Pt states he wishes to one day go back to him when he returns. Pt is here for continuation of care.      Pt is a 61yoM who returns to this office to re-establish care; he was Last seen here 10/01/2016 Pt moved back here from [place] in September.  He had insurance while there (in [place]) He has not applied for medicaid yet He Stopped ozempic about 2 months ago.  He Checked bs this morning; it was 208.   He usually checks fbs qd.      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  He was on xarelto in the past.  He is On advair for copd.  He has stopped smoking.   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    He has Bilateral OA but the right knee is the one that bothers him.   He was getting cortisone injections in the knee  abd CT 10/22- ectasia, no aneurysm.   NAFLD  He got signed up for [facility]  He ran out of protonix couple months ago but is having no symptoms  Pt says he is doing fine today and has no immediate  issues.     Relevant past medical, surgical, family and social history reviewed and updated as indicated. Interim medical history since our last visit reviewed. Allergies and medications reviewed and updated.    Current Outpatient Medications:    albuterol (PROVENTIL HFA;VENTOLIN HFA) 108 (90 Base) MCG/ACT inhaler, Inhale 1-2 puffs into the lungs every 6 (six) hours as needed for wheezing or shortness of breath., Disp: 1 Inhaler, Rfl: 1   albuterol (PROVENTIL) (2.5 MG/3ML) 0.083% nebulizer solution, Take 3 mLs (2.5 mg total) by nebulization every 6 (six) hours as needed for wheezing or shortness of breath., Disp: 75 mL, Rfl: 2   apixaban (ELIQUIS) 2.5 MG TABS tablet, Take 5 mg by mouth 2 (two) times daily., Disp: , Rfl:    atorvastatin (LIPITOR) 40 MG tablet, Take 1 tablet by mouth daily., Disp: , Rfl:    cyclobenzaprine (FLEXERIL) 5 MG tablet, Take 5 mg by mouth 3 (three) times daily as needed for muscle spasms., Disp: ,  Rfl:    fluticasone-salmeterol (ADVAIR) 500-50 MCG/ACT AEPB, Inhale into the lungs., Disp: , Rfl:    guaiFENesin (MUCINEX) 600 MG 12 hr tablet, Take 600 mg by mouth every 12 (twelve) hours., Disp: , Rfl:    insulin glargine (LANTUS) 100 UNIT/ML injection, Inject 50 Units into the skin daily., Disp: , Rfl:    levocetirizine (XYZAL) 5 MG tablet, Take 5 mg by mouth every evening., Disp: , Rfl:    losartan (COZAAR) 25 MG tablet, Take 25 mg by mouth daily., Disp: , Rfl:    meclizine (ANTIVERT) 25 MG tablet, Take 25 mg by mouth 3 (three) times daily as needed for dizziness or nausea., Disp: , Rfl:    metoprolol tartrate (LOPRESSOR) 25 MG tablet, Take 1 tablet (25 mg total) by mouth 2 (two) times daily., Disp: 60 tablet, Rfl: 0   tamsulosin (FLOMAX) 0.4 MG CAPS capsule, Take 0.4 mg by mouth daily., Disp: , Rfl:    tiotropium (SPIRIVA) 18 MCG inhalation capsule, Place 2.5 mcg into inhaler and inhale daily., Disp: , Rfl:    diclofenac Sodium (VOLTAREN) 1 % GEL, Apply 4 g topically 4  (four) times daily. (Patient not taking: Reported on 10/16/2022), Disp: 350 g, Rfl: 0   pantoprazole (PROTONIX) 40 MG tablet, Take 40 mg by mouth daily. (Patient not taking: Reported on 10/16/2022), Disp: , Rfl:    tiZANidine (ZANAFLEX) 4 MG tablet, Take 1 tablet (4 mg total) by mouth every 6 (six) hours as needed for muscle spasms. (Patient not taking: Reported on 10/16/2022), Disp: 12 tablet, Rfl: 0   traMADol (ULTRAM) 50 MG tablet, Take by mouth. (Patient not taking: Reported on 08/05/2022), Disp: , Rfl:     Review of Systems  Per HPI unless specifically indicated above     Objective:    BP 119/72   Pulse 86   Temp 97.8 F (36.6 C)   Ht 5' 11.5" (1.816 m)   Wt 253 lb (114.8 kg)   SpO2 97%   BMI 34.79 kg/m   Wt Readings from Last 3 Encounters:  10/16/22 253 lb (114.8 kg)  09/13/22 282 lb 3 oz (128 kg)  08/05/22 282 lb (127.9 kg)    Physical Exam Vitals reviewed.  Constitutional:      General: He is not in acute distress.    Appearance: He is well-developed. He is not ill-appearing.  HENT:     Head: Normocephalic and atraumatic.     Right Ear: Tympanic membrane, ear canal and external ear normal.     Left Ear: Tympanic membrane, ear canal and external ear normal.  Eyes:     Extraocular Movements: Extraocular movements intact.     Conjunctiva/sclera: Conjunctivae normal.     Pupils: Pupils are equal, round, and reactive to light.  Neck:     Thyroid: No thyromegaly.  Cardiovascular:     Rate and Rhythm: Normal rate and regular rhythm.  Pulmonary:     Effort: Pulmonary effort is normal.     Breath sounds: Normal breath sounds. No wheezing or rales.  Abdominal:     General: Bowel sounds are normal.     Palpations: Abdomen is soft. There is no mass.     Tenderness: There is no abdominal tenderness.  Musculoskeletal:     Cervical back: Neck supple.     Right lower leg: No edema.     Left lower leg: No edema.  Lymphadenopathy:     Cervical: No cervical adenopathy.   Skin:    General: Skin  is warm and dry.     Findings: No rash.  Neurological:     Mental Status: He is alert and oriented to person, place, and time.     Motor: No weakness or tremor.     Gait: Gait is intact.  Psychiatric:        Behavior: Behavior normal.            Assessment & Plan:    Encounter Diagnoses  Name Primary?   Encounter to establish care Yes   Uncontrolled type 2 diabetes mellitus with hyperglycemia (HCC)    Long term current use of anticoagulant therapy    Essential hypertension, benign    Chronic obstructive pulmonary disease, unspecified COPD type (HCC)    History of pulmonary embolism    History of colonic polyps    Primary osteoarthritis of right knee    Need for influenza vaccination    Screening for prostate cancer    Abnormal chest CT    Pulmonary granuloma (HCC)      -will Update labs -completed Pt Assistance aPplication for eliquis -completed Pt Assistance aPplication for lantus insulin -Refer orthopedist for knee OA and pain -Flu shot  was given today -pt encouraged to submit application for [employer] financial assistance -DM foot exam completed today -Rx sent to [facility] -discussed with pt will need repeat CT for lung granulomas in June 2024 -pt to follow up here 1 month.  He is to contact office sooner prn   Pt recommended to cancel appointments seen in Epic in [place]: Appts- urology- 12/25/22 Optometry-01/15/23-  Cardiology- 05/24/23

## 2022-10-16 NOTE — Telephone Encounter (Signed)
Called to follow up with client after re-establishing primary care with [facility], he states all went well. He is aware of his next appointment that he has labs to get drawn also.  He does check his blood sugars daily and it was elevated this am that he told the provider about, He is willing to start working on getting them under control. His last A1C was 8.0 on 06/04/22 in [place]. He is scheduled to come in on 10/18/22 to complete CAFA with A. McCray. Will plan to follow up with him in person again that day.   Plan: CAFA for referral to ortho.  Will continue to follow as needed. Reports no further needs at this time.  Francee Nodal RN [employer]

## 2022-10-16 NOTE — Patient Instructions (Addendum)
-----------------------------------   Eliquis- [facility] patient assistance program Flomax- rx and coupon Guiafenesin(mucinex)- over-the-counter Xyzal - over-the-counter Lantus- [employer] patient assistance program -----------------------------------    Fasting labs (to be done at [facility]) [employer] financial assistance application ([employer] [facility]) Orthopedist will call you for appointment

## 2022-10-30 ENCOUNTER — Encounter: Payer: Self-pay | Admitting: Physician Assistant

## 2022-11-05 ENCOUNTER — Telehealth: Payer: Self-pay | Admitting: Physician Assistant

## 2022-11-05 NOTE — Telephone Encounter (Signed)
Pt left VM over the weekend reporting elevated bs.  Pt was called back and reports that his blood sugars have almost all been over 300.    He thinks the lowest he has had was 294.  He is currently using 50units lantus.  He has not gotten his labs drawn yet but is planning to do that Wednesday.   He has appointment to follow up in office 11-20-22.  Pt is encouraged to increase lantus to 60units daily, watch diabetic diet and get labs drawn.  Will lilkely add additional medication after review of labs.

## 2022-11-13 ENCOUNTER — Other Ambulatory Visit (HOSPITAL_COMMUNITY)
Admission: RE | Admit: 2022-11-13 | Discharge: 2022-11-13 | Disposition: A | Discharge status: Home / Self Care | Payer: Medicaid Other | Source: Ambulatory Visit | Attending: Physician Assistant | Admitting: Physician Assistant

## 2022-11-13 DIAGNOSIS — E1165 Type 2 diabetes mellitus with hyperglycemia: Secondary | ICD-10-CM | POA: Insufficient documentation

## 2022-11-13 DIAGNOSIS — Z125 Encounter for screening for malignant neoplasm of prostate: Secondary | ICD-10-CM | POA: Diagnosis present

## 2022-11-13 DIAGNOSIS — I10 Essential (primary) hypertension: Secondary | ICD-10-CM | POA: Insufficient documentation

## 2022-11-13 LAB — COMPREHENSIVE METABOLIC PANEL
ALT: 19 U/L (ref 0–44)
AST: 20 U/L (ref 15–41)
Albumin: 4.1 g/dL (ref 3.5–5.0)
Alkaline Phosphatase: 70 U/L (ref 38–126)
Anion gap: 10 (ref 5–15)
BUN: 19 mg/dL (ref 8–23)
CO2: 24 mmol/L (ref 22–32)
Calcium: 9.3 mg/dL (ref 8.9–10.3)
Chloride: 95 mmol/L — ABNORMAL LOW (ref 98–111)
Creatinine, Ser: 0.98 mg/dL (ref 0.61–1.24)
GFR, Estimated: >60 mL/min (ref >60)
Glucose, Bld: 548 mg/dL (ref 70–99)
Potassium: 4.7 mmol/L (ref 3.5–5.1)
Sodium: 129 mmol/L — ABNORMAL LOW (ref 135–145)
Total Bilirubin: 2.1 mg/dL — ABNORMAL HIGH (ref 0.3–1.2)
Total Protein: 7.6 g/dL (ref 6.5–8.1)

## 2022-11-13 LAB — LIPID PANEL
Cholesterol: 108 mg/dL (ref 0–200)
HDL: 28 mg/dL — ABNORMAL LOW (ref >40)
LDL Cholesterol: 44 mg/dL (ref 0–99)
Total CHOL/HDL Ratio: 3.9 RATIO
Triglycerides: 179 mg/dL — ABNORMAL HIGH (ref <150)
VLDL: 36 mg/dL (ref 0–40)

## 2022-11-13 LAB — PSA: Prostatic Specific Antigen: 0.29 ng/mL (ref 0.00–4.00)

## 2022-11-14 ENCOUNTER — Encounter: Payer: Self-pay | Admitting: Physician Assistant

## 2022-11-14 ENCOUNTER — Ambulatory Visit: Payer: Self-pay | Admitting: Physician Assistant

## 2022-11-14 ENCOUNTER — Other Ambulatory Visit: Payer: Self-pay | Admitting: Physician Assistant

## 2022-11-14 VITALS — BP 121/78 | HR 69 | Temp 97.3°F | Ht 71.5 in | Wt 243.0 lb

## 2022-11-14 DIAGNOSIS — E1165 Type 2 diabetes mellitus with hyperglycemia: Secondary | ICD-10-CM

## 2022-11-14 LAB — POCT URINALYSIS DIPSTICK
Bilirubin, UA: NEGATIVE
Blood, UA: NEGATIVE
Glucose, UA: POSITIVE — AB
Ketones, UA: NEGATIVE
Leukocytes, UA: NEGATIVE
Nitrite, UA: NEGATIVE
Protein, UA: NEGATIVE
Spec Grav, UA: 1.015 (ref 1.010–1.025)
Urobilinogen, UA: 0.2 E.U./dL
pH, UA: 5 (ref 5.0–8.0)

## 2022-11-14 LAB — HEMOGLOBIN A1C
Hgb A1c MFr Bld: 14.2 % — ABNORMAL HIGH (ref 4.8–5.6)
Mean Plasma Glucose: 361 mg/dL

## 2022-11-14 LAB — GLUCOSE, POCT (MANUAL RESULT ENTRY): POC Glucose: 361 mg/dl — AB (ref 70–99)

## 2022-11-14 MED ORDER — JANUMET 50-1000 MG PO TABS: 1.0000 | ORAL_TABLET | Freq: Two times a day (BID) | ORAL | 0 refills | Status: DC

## 2022-11-14 MED ORDER — CYCLOBENZAPRINE HCL 5 MG PO TABS: 5.0000 mg | ORAL_TABLET | Freq: Two times a day (BID) | ORAL | 0 refills | Status: DC | PRN

## 2022-11-14 NOTE — Progress Notes (Signed)
BP 121/78   Pulse 69   Temp (!) 97.3 F (36.3 C)   Ht 5' 11.5" (1.816 m)   Wt 243 lb (110.2 kg)   SpO2 96%   BMI 33.42 kg/m    Subjective:    Patient ID: Gary Burke, male    DOB: [birth year], 62 y.o.   MRN: [id number]  HPI: LLEWELLYN CHOPLIN is a 62 y.o. male presenting on 11/14/2022 for Follow-up   HPI  Pt is 27yoM who is in for follow up to new patient appointment last month.  His appointment was scheduled for next week, but when labs were done, appt was moved up.   Pt says bs 455 this morning at home.   He is using 60units lantus.  He is trying to follow diabetic diet  He says he is feeling fine.  His cell phone number is [phone].  This was confirmed with a call placed during his appointment by provider.    He submitted applications for cafa and medicaid  He still hasn't been scheduled with orthopedics for the knee.  They had requested records for both knees.    He says he takes the flexeril for back pain.   He takes 3 daily.    He has never done PT.  He says he had xrays at one point when he was living in [place] and was told that he has arthritis.  He walks about 15 minutes about every day.     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    Relevant past medical, surgical, family and social history reviewed and updated as indicated. Interim medical history since our last visit reviewed. Allergies and medications reviewed and updated.   Current Outpatient Medications:    albuterol (PROVENTIL) (2.5 MG/3ML) 0.083% nebulizer solution, Take 3 mLs (2.5 mg total) by nebulization every 6 (six) hours as needed for wheezing or shortness of breath., Disp: 75 mL, Rfl: 0   albuterol (VENTOLIN HFA) 108 (90 Base) MCG/ACT inhaler, Inhale 1-2 puffs into the lungs every 6 (six) hours as needed for wheezing or shortness of breath., Disp: 3 each, Rfl: 0   apixaban (ELIQUIS) 5 MG TABS tablet, Take 1 tablet (5 mg  total) by mouth 2 (two) times daily., Disp: 180 tablet, Rfl: 3   atorvastatin (LIPITOR) 40 MG tablet, Take 1 tablet (40 mg total) by mouth daily., Disp: 90 tablet, Rfl: 0   fluticasone-salmeterol (ADVAIR) 500-50 MCG/ACT AEPB, Inhale 1 puff into the lungs in the morning and at bedtime., Disp: 3 each, Rfl: 0   insulin glargine (LANTUS) 100 UNIT/ML injection, Inject 60 Units into the skin daily., Disp: , Rfl:    levocetirizine (XYZAL) 5 MG tablet, Take 5 mg by mouth every evening., Disp: , Rfl:    meclizine (ANTIVERT) 25 MG tablet, Take 25 mg by mouth 3 (three) times daily as needed for dizziness or nausea., Disp: , Rfl:    metoprolol tartrate (LOPRESSOR) 25 MG tablet, Take 1 tablet (25 mg total) by mouth 2 (two) times daily., Disp: 180 tablet, Rfl: 0   tamsulosin (FLOMAX) 0.4 MG CAPS capsule, Take 1 capsule (0.4 mg total) by mouth daily., Disp: 30 capsule, Rfl: 4   cyclobenzaprine (FLEXERIL) 5 MG tablet, Take 1 tablet (5 mg total) by mouth 3 (three) times daily as needed for muscle spasms. (Patient not taking: Reported on 11/14/2022), Disp: 90 tablet, Rfl: 0   diclofenac  Sodium (VOLTAREN) 1 % GEL, Apply 4 g topically 4 (four) times daily. (Patient not taking: Reported on 10/16/2022), Disp: 350 g, Rfl: 0   guaiFENesin (MUCINEX) 600 MG 12 hr tablet, Take 600 mg by mouth every 12 (twelve) hours. (Patient not taking: Reported on 11/14/2022), Disp: , Rfl:    losartan (COZAAR) 25 MG tablet, Take 1 tablet (25 mg total) by mouth daily., Disp: 90 tablet, Rfl: 0   tiZANidine (ZANAFLEX) 4 MG tablet, Take 1 tablet (4 mg total) by mouth every 6 (six) hours as needed for muscle spasms. (Patient not taking: Reported on 10/16/2022), Disp: 12 tablet, Rfl: 0   traMADol (ULTRAM) 50 MG tablet, Take by mouth. (Patient not taking: Reported on 11/14/2022), Disp: , Rfl:     Review of Systems  Per HPI unless specifically indicated above     Objective:    BP 121/78   Pulse 69   Temp (!) 97.3 F (36.3 C)   Ht 5' 11.5"  (1.816 m)   Wt 243 lb (110.2 kg)   SpO2 96%   BMI 33.42 kg/m   Wt Readings from Last 3 Encounters:  11/14/22 243 lb (110.2 kg)  10/16/22 253 lb (114.8 kg)  09/13/22 282 lb 3 oz (128 kg)    Physical Exam Vitals reviewed.  Constitutional:      General: He is not in acute distress.    Appearance: He is well-developed. He is not ill-appearing.  HENT:     Head: Normocephalic and atraumatic.  Cardiovascular:     Rate and Rhythm: Normal rate and regular rhythm.  Pulmonary:     Effort: Pulmonary effort is normal.     Breath sounds: Normal breath sounds. No wheezing.  Abdominal:     General: Bowel sounds are normal.     Palpations: Abdomen is soft.     Tenderness: There is no abdominal tenderness.  Musculoskeletal:     Cervical back: Neck supple.     Right lower leg: No edema.     Left lower leg: No edema.  Lymphadenopathy:     Cervical: No cervical adenopathy.  Skin:    General: Skin is warm and dry.  Neurological:     Mental Status: He is alert and oriented to person, place, and time.  Psychiatric:        Behavior: Behavior normal.     Results for orders placed or performed during the hospital encounter of 11/13/22  Hemoglobin A1c  Result Value Ref Range   Hgb A1c MFr Bld 14.2 (H) 4.8 - 5.6 %   Mean Plasma Glucose 361 mg/dL  Lipid panel  Result Value Ref Range   Cholesterol 108 0 - 200 mg/dL   Triglycerides 179 (H) <150 mg/dL   HDL 28 (L) >40 mg/dL   Total CHOL/HDL Ratio 3.9 RATIO   VLDL 36 0 - 40 mg/dL   LDL Cholesterol 44 0 - 99 mg/dL  PSA  Result Value Ref Range   Prostatic Specific Antigen 0.29 0.00 - 4.00 ng/mL  Comprehensive metabolic panel  Result Value Ref Range   Sodium 129 (L) 135 - 145 mmol/L   Potassium 4.7 3.5 - 5.1 mmol/L   Chloride 95 (L) 98 - 111 mmol/L   CO2 24 22 - 32 mmol/L   Glucose, Bld 548 (HH) 70 - 99 mg/dL   BUN 19 8 - 23 mg/dL   Creatinine, Ser 0.98 0.61 - 1.24 mg/dL   Calcium 9.3 8.9 - 10.3 mg/dL   Total Protein 7.6 6.5 -  8.1 g/dL    Albumin 4.1 3.5 - 5.0 g/dL   AST 20 15 - 41 U/L   ALT 19 0 - 44 U/L   Alkaline Phosphatase 70 38 - 126 U/L   Total Bilirubin 2.1 (H) 0.3 - 1.2 mg/dL   GFR, Estimated >97 >35 mL/min   Anion gap 10 5 - 15    Fbs   361 (in office)  Urinalysis    Component Value Date/Time   COLORURINE AMBER (A) 07/31/2022 1229   APPEARANCEUR CLEAR 07/31/2022 1229   LABSPEC 1.031 (H) 07/31/2022 1229   PHURINE 5.0 07/31/2022 1229   GLUCOSEU 50 (A) 07/31/2022 1229   HGBUR NEGATIVE 07/31/2022 1229   BILIRUBINUR NEG 11/14/2022 0950   KETONESUR NEGATIVE 07/31/2022 1229   PROTEINUR Negative 11/14/2022 0950   PROTEINUR 30 (A) 07/31/2022 1229   UROBILINOGEN 0.2 11/14/2022 0950   UROBILINOGEN 0.2 05/14/2009 0930   NITRITE NEG 11/14/2022 0950   NITRITE NEGATIVE 07/31/2022 1229   LEUKOCYTESUR Negative 11/14/2022 0950   LEUKOCYTESUR NEGATIVE 07/31/2022 1229          Assessment & Plan:    Encounter Diagnosis  Name Primary?   Uncontrolled type 2 diabetes mellitus with hyperglycemia (HCC) Yes     -reviewed labs with pt -pt is given samples janumet to add to his DM meds.  Rx is sent to [facility].  He is to continue 60 units lantus.  He is to continue to monitor bs.  He is reminded to call office for fbs > 300 or < 70 -discussed that once his blood sugars are not in the dangerous range, will look into his back pain.  Discussed that flexeril is a prn med and encouraged him to try cutting back to two daily (from his current 3 daily).    Discussed other things he can use (ice, heat, apap, voltaren gel) -pt to follow up with bs log in 2 weeks.  He is to contact office sooner prn

## 2022-11-14 NOTE — Patient Instructions (Addendum)
You cell phone number is  [phone]  Bring blood sugar log and meds to every appointment.

## 2022-11-20 ENCOUNTER — Ambulatory Visit (INDEPENDENT_AMBULATORY_CARE_PROVIDER_SITE_OTHER): Payer: Medicaid Other | Admitting: Orthopedic Surgery

## 2022-11-20 ENCOUNTER — Ambulatory Visit: Payer: Self-pay | Admitting: Physician Assistant

## 2022-11-20 ENCOUNTER — Encounter: Payer: Self-pay | Admitting: Orthopedic Surgery

## 2022-11-20 ENCOUNTER — Telehealth: Payer: Self-pay | Admitting: Physician Assistant

## 2022-11-20 VITALS — BP 136/91 | HR 101 | Ht 71.5 in | Wt 260.0 lb

## 2022-11-20 DIAGNOSIS — M1711 Unilateral primary osteoarthritis, right knee: Secondary | ICD-10-CM

## 2022-11-20 DIAGNOSIS — M170 Bilateral primary osteoarthritis of knee: Secondary | ICD-10-CM | POA: Diagnosis not present

## 2022-11-20 DIAGNOSIS — M1712 Unilateral primary osteoarthritis, left knee: Secondary | ICD-10-CM

## 2022-11-20 NOTE — Progress Notes (Signed)
New Patient Visit  Assessment: Gary Burke is a 62 y.o. male with the following: 1.  Bilateral knee arthritis; right currently more symptomatic than the left.  Plan: Gary Burke has pain in both knees.  No specific injury.  Radiographs demonstrate mild to moderate degenerative changes.  Right is currently more painful than the left.  Specifically within the right knee, he has tenderness to palpation of the medial femoral condyle.  He may have some bone bruising or insufficiency fracture in this area.  He is interested in proceeding with bilateral knee injections.  This was completed in clinic today without issues.  He will follow-up as needed.  He was also fitted for a hinged knee brace.  He can return to clinic for repeat injections, but must wait at least 3 months.  Procedure note injection Right knee joint   Verbal consent was obtained to inject the right knee joint  Timeout was completed to confirm the site of injection.  The skin was prepped with alcohol and ethyl chloride was sprayed at the injection site.  A 21-gauge needle was used to inject 40 mg of Depo-Medrol and 1% lidocaine (3 cc) into the right knee using an anterolateral approach.  There were no complications. A sterile bandage was applied.   Procedure note injection Left knee joint   Verbal consent was obtained to inject the left knee joint  Timeout was completed to confirm the site of injection.  The skin was prepped with alcohol and ethyl chloride was sprayed at the injection site.  A 21-gauge needle was used to inject 40 mg of Depo-Medrol and 1% lidocaine (3 cc) into the left knee using an anterolateral approach.  There were no complications. A sterile bandage was applied.     Follow-up: Return if symptoms worsen or fail to improve.  Subjective:  Chief Complaint  Patient presents with   Knee Pain    R > L for 1-2 yrs.     History of Present Illness: Gary Burke is a 62 y.o. male who has been referred by  Soyla Dryer, PA-C for evaluation of bilateral knee pain.  He has had pain in both knees for over a year.  Right is worse than the left.  He has had injections in the past, and these have been helpful.  No specific injury.  Pain is primarily in the medial aspect of both knees.  He has not tried a brace.  It has been a while since he has had an injection.  He is interested in an injection today.  He takes Tylenol for pain.   Review of Systems: No fevers or chills No numbness or tingling No chest pain No shortness of breath No bowel or bladder dysfunction No GI distress No headaches   Medical History:  Past Medical History:  Diagnosis Date   Bipolar disorder ([place])    COPD (chronic obstructive pulmonary disease) ([place])    Diabetes mellitus without complication ([place]) 38/7564   Hypertension    Pulmonary embolism (HCC)    L lung x2   Sleep apnea     Past Surgical History:  Procedure Laterality Date   APPENDECTOMY  age 38    Family History  Problem Relation Age of Onset   Diabetes Daughter        Diabetes Type 1   Social History   Tobacco Use   Smoking status: Former    Packs/day: 0.50    Years: 37.00    Total pack years:  18.50    Types: Cigarettes    Quit date: 10/29/2014    Years since quitting: 8.0   Smokeless tobacco: Never  Substance Use Topics   Alcohol use: No   Drug use: No    Allergies  Allergen Reactions   Penicillins Rash    Has patient had a PCN reaction causing immediate rash, facial/tongue/throat swelling, SOB or lightheadedness with hypotension: Yes Has patient had a PCN reaction causing severe rash involving mucus membranes or skin necrosis: No Has patient had a PCN reaction that required hospitalization No Has patient had a PCN reaction occurring within the last 10 years: No  If all of the above answers are "NO", then may proceed with Cephalosporin use.     Current Meds  Medication Sig   albuterol (PROVENTIL) (2.5 MG/3ML) 0.083% nebulizer  solution Take 3 mLs (2.5 mg total) by nebulization every 6 (six) hours as needed for wheezing or shortness of breath.   albuterol (VENTOLIN HFA) 108 (90 Base) MCG/ACT inhaler Inhale 1-2 puffs into the lungs every 6 (six) hours as needed for wheezing or shortness of breath.   apixaban (ELIQUIS) 5 MG TABS tablet Take 1 tablet (5 mg total) by mouth 2 (two) times daily.   atorvastatin (LIPITOR) 40 MG tablet Take 1 tablet (40 mg total) by mouth daily.   cyclobenzaprine (FLEXERIL) 5 MG tablet Take 1 tablet (5 mg total) by mouth 2 (two) times daily as needed for muscle spasms.   fluticasone-salmeterol (ADVAIR) 500-50 MCG/ACT AEPB Inhale 1 puff into the lungs in the morning and at bedtime.   insulin glargine (LANTUS) 100 UNIT/ML injection Inject 60 Units into the skin daily.   levocetirizine (XYZAL) 5 MG tablet Take 5 mg by mouth every evening.   losartan (COZAAR) 25 MG tablet Take 1 tablet (25 mg total) by mouth daily.   meclizine (ANTIVERT) 25 MG tablet Take 25 mg by mouth 3 (three) times daily as needed for dizziness or nausea.   metoprolol tartrate (LOPRESSOR) 25 MG tablet Take 1 tablet (25 mg total) by mouth 2 (two) times daily.   sitaGLIPtin-metformin (JANUMET) 50-1000 MG tablet Take 1 tablet by mouth 2 (two) times daily with a meal.   tamsulosin (FLOMAX) 0.4 MG CAPS capsule Take 1 capsule (0.4 mg total) by mouth daily.    Objective: BP (!) 136/91   Pulse (!) 101   Ht 5' 11.5" (1.816 m)   Wt 260 lb (117.9 kg)   BMI 35.76 kg/m   Physical Exam:  General: Alert and oriented. and No acute distress. Gait: Right sided antalgic gait.  Bilateral knees without effusion.  He has some mild prominence over the medial femoral condyle.  Tenderness to palpation in this area.  Mild varus alignment clinically.  This does recreate, with a little bit of laxity.  Tenderness to palpation along the medial joint line.  Negative Lachman bilaterally.  IMAGING: I personally reviewed images previously obtained in  clinic  X-rays of both knees were previously obtained.  Mild degenerative changes are noted.  Moderate loss of joint space within the medial compartments bilaterally.  There are associated osteophytes.  Mild varus alignment.   New Medications:  No orders of the defined types were placed in this encounter.     Mordecai Rasmussen, MD  11/20/2022 2:22 PM

## 2022-11-20 NOTE — Congregational Nurse Program (Signed)
Patient presented to onsite [employer] [facility] [employer] for return visit for family of 2  to shop for 9.70lbs food

## 2022-11-20 NOTE — Telephone Encounter (Signed)
Pt was called to check on his blood sugars.  He is taking the janumet twice daily as discussed at [facility] last week.  He is watching his diabetic diet.  He says fbs 239 this morning and 299 yesterday morning.   He had some increase in urine frequency when he first started the janumet but says that he has returned to his usual urine frequency.     Pt has medicaid now and has new patient appointment at [facility] clinic on March 22.   He is going to be out of town February 27-March 20.    Pt says he has appointment with orthopedics this afternoon for his knee.    This provider called the [facility] to see if it was possible to get pt seen sooner than March 22 and was told that no, they could not see him any sooner and they recommended hospital or urgent care for his needs prior to march 22.    Pt was called and notified of being unable to get him seen any sooner with his new PCP.  He is encouraged to continue janumet bid and increase the lantus to 65units daily.  He is told that he can call if he has elevated bs.

## 2022-11-20 NOTE — Patient Instructions (Signed)

## 2022-12-04 ENCOUNTER — Ambulatory Visit: Payer: Self-pay | Admitting: Physician Assistant

## 2022-12-27 ENCOUNTER — Encounter: Payer: Self-pay | Admitting: Radiology

## 2023-01-16 ENCOUNTER — Telehealth: Payer: Self-pay | Admitting: Orthopedic Surgery

## 2023-01-16 NOTE — Telephone Encounter (Signed)
Dr. Amedeo Kinsman patient - pt called, wanted an appointment for an injection, not due until after 02/19/23, made him an appointment for 02/20/23.  He would like to know if he can get something called in to help w/the pain.  He uses [facility].  Pt's # [phone]

## 2023-01-17 MED ORDER — TRAMADOL HCL 50 MG PO TABS: 50.0000 mg | ORAL_TABLET | Freq: Two times a day (BID) | ORAL | 0 refills | Status: DC | PRN

## 2023-01-18 NOTE — Telephone Encounter (Signed)
Called and let pt know a short course rx was sent over to get him to his next injection. Pt verbalized understanding.

## 2023-01-25 DIAGNOSIS — E1165 Type 2 diabetes mellitus with hyperglycemia: Secondary | ICD-10-CM | POA: Diagnosis not present

## 2023-01-25 DIAGNOSIS — Z794 Long term (current) use of insulin: Secondary | ICD-10-CM | POA: Diagnosis not present

## 2023-02-07 ENCOUNTER — Telehealth: Payer: Self-pay | Admitting: Orthopedic Surgery

## 2023-02-07 NOTE — Telephone Encounter (Signed)
Gary Burke, spoke w/this patient this morning, he is requesting help w/transportation for his 02/20/23 9:30am appointment with Dr. Dallas Schimke.  The pt's # is [phone]

## 2023-02-16 DIAGNOSIS — E1165 Type 2 diabetes mellitus with hyperglycemia: Secondary | ICD-10-CM | POA: Diagnosis not present

## 2023-02-16 DIAGNOSIS — Z794 Long term (current) use of insulin: Secondary | ICD-10-CM | POA: Diagnosis not present

## 2023-02-20 ENCOUNTER — Encounter: Payer: Self-pay | Admitting: Orthopedic Surgery

## 2023-02-20 ENCOUNTER — Ambulatory Visit (INDEPENDENT_AMBULATORY_CARE_PROVIDER_SITE_OTHER): Payer: 59 | Admitting: Orthopedic Surgery

## 2023-02-20 DIAGNOSIS — E1165 Type 2 diabetes mellitus with hyperglycemia: Secondary | ICD-10-CM | POA: Diagnosis not present

## 2023-02-20 DIAGNOSIS — Z794 Long term (current) use of insulin: Secondary | ICD-10-CM | POA: Diagnosis not present

## 2023-02-20 DIAGNOSIS — M1711 Unilateral primary osteoarthritis, right knee: Secondary | ICD-10-CM

## 2023-02-20 DIAGNOSIS — M1712 Unilateral primary osteoarthritis, left knee: Secondary | ICD-10-CM

## 2023-02-20 DIAGNOSIS — M170 Bilateral primary osteoarthritis of knee: Secondary | ICD-10-CM | POA: Diagnosis not present

## 2023-02-20 MED ORDER — METHYLPREDNISOLONE ACETATE 40 MG/ML IJ SUSP
40.0000 mg | Freq: Once | INTRAMUSCULAR | Status: AC
  Administered 2023-02-20: 40 mg via INTRA_ARTICULAR

## 2023-02-20 NOTE — Progress Notes (Signed)
Return patient Visit  Assessment: Gary Burke is a 62 y.o. male with the following: 1.  Bilateral knee arthritis  Plan: Gary Burke has moderate degenerative changes in bilateral knees.  Prior injections lasted close to 3 months.  He would like to repeat injections today.  These were completed without issues.  He will contact clinic if he has any further issues.   Procedure note injection Right knee joint   Verbal consent was obtained to inject the right knee joint  Timeout was completed to confirm the site of injection.  The skin was prepped with alcohol and ethyl chloride was sprayed at the injection site.  A 21-gauge needle was used to inject 40 mg of Depo-Medrol and 1% lidocaine (3 cc) into the right knee using an anterolateral approach.  There were no complications. A sterile bandage was applied.   Procedure note injection Left knee joint   Verbal consent was obtained to inject the left knee joint  Timeout was completed to confirm the site of injection.  The skin was prepped with alcohol and ethyl chloride was sprayed at the injection site.  A 21-gauge needle was used to inject 40 mg of Depo-Medrol and 1% lidocaine (3 cc) into the left knee using an anterolateral approach.  There were no complications. A sterile bandage was applied.     Follow-up: Return if symptoms worsen or fail to improve.  Subjective:  Chief Complaint  Patient presents with   Injections    Both knees     History of Present Illness: Gary Burke is a 62 y.o. male who returns to clinic for repeat evaluation of bilateral knees.  I saw him in clinic over 3 months ago, at which time we injected both knees.  Injections have been very helpful.  They improved his symptoms until recently.  He presents today wanting repeat injections.   Review of Systems: No fevers or chills No numbness or tingling No chest pain No shortness of breath No bowel or bladder dysfunction No GI distress No  headaches   Objective: There were no vitals taken for this visit.  Physical Exam:  General: Alert and oriented. and No acute distress. Gait: Right sided antalgic gait.  Bilateral knees without effusion.  He has some mild prominence over the medial femoral condyle.  Tenderness to palpation in this area.  Mild varus alignment clinically.  This does recreate, with a little bit of laxity.  Tenderness to palpation along the medial joint line.  Negative Lachman bilaterally.  IMAGING: I personally reviewed images previously obtained in clinic  X-rays of both knees were previously obtained.  Mild degenerative changes are noted.  Moderate loss of joint space within the medial compartments bilaterally.  There are associated osteophytes.  Mild varus alignment.   New Medications:  Meds ordered this encounter  Medications   methylPREDNISolone acetate (DEPO-MEDROL) injection 40 mg   methylPREDNISolone acetate (DEPO-MEDROL) injection 40 mg      Oliver Barre, MD  02/20/2023 9:50 AM

## 2023-02-20 NOTE — Patient Instructions (Addendum)
Ok to return to work - please provide a note   Instructions Following Joint Injections  In clinic today, you received an injection in one of your joints (sometimes more than one).  Occasionally, you can have some pain at the injection site, this is normal.  You can place ice at the injection site, or take over-the-counter medications such as Tylenol (acetaminophen) or Advil (ibuprofen).  Please follow all directions listed on the bottle.  If your joint (knee or shoulder) becomes swollen, red or very painful, please contact the clinic for additional assistance.   Two medications were injected, including lidocaine and a steroid (often referred to as cortisone).  Lidocaine is effective almost immediately but wears off quickly.  However, the steroid can take a few days to improve your symptoms.  In some cases, it can make your pain worse for a couple of days.  Do not be concerned if this happens as it is common.  You can apply ice or take some over-the-counter medications as needed.

## 2023-03-12 ENCOUNTER — Ambulatory Visit: Payer: Medicaid Other | Admitting: Nutrition

## 2023-03-26 DIAGNOSIS — Z125 Encounter for screening for malignant neoplasm of prostate: Secondary | ICD-10-CM | POA: Diagnosis not present

## 2023-03-26 DIAGNOSIS — I10 Essential (primary) hypertension: Secondary | ICD-10-CM | POA: Diagnosis not present

## 2023-03-26 DIAGNOSIS — Z79899 Other long term (current) drug therapy: Secondary | ICD-10-CM | POA: Diagnosis not present

## 2023-03-26 DIAGNOSIS — E7800 Pure hypercholesterolemia, unspecified: Secondary | ICD-10-CM | POA: Diagnosis not present

## 2023-03-26 DIAGNOSIS — Z7689 Persons encountering health services in other specified circumstances: Secondary | ICD-10-CM | POA: Diagnosis not present

## 2023-03-26 DIAGNOSIS — E119 Type 2 diabetes mellitus without complications: Secondary | ICD-10-CM | POA: Diagnosis not present

## 2023-03-26 LAB — LIPID PANEL
LDL Cholesterol: 78
Triglycerides: 593 — AB (ref 40–160)

## 2023-03-26 LAB — BASIC METABOLIC PANEL
BUN: 19 (ref 4–21)
Creatinine: 1.1 (ref 0.6–1.3)
Glucose: 580

## 2023-03-26 LAB — HEMOGLOBIN A1C: Hemoglobin A1C: 12

## 2023-03-26 LAB — COMPREHENSIVE METABOLIC PANEL: eGFR: 75

## 2023-03-26 LAB — TSH: TSH: 1.37 (ref 0.41–5.90)

## 2023-03-28 ENCOUNTER — Encounter: Payer: Self-pay | Admitting: *Deleted

## 2023-04-10 ENCOUNTER — Telehealth: Payer: Self-pay | Admitting: Internal Medicine

## 2023-04-10 ENCOUNTER — Telehealth: Payer: Self-pay | Admitting: *Deleted

## 2023-04-10 NOTE — Telephone Encounter (Signed)
Procedure: colonoscopy  Height: 6'0" Weight: 261      BMI: 35.4  Have you had a colon[place] before?  Yes, [place]  Do you have family history of colon cancer? no  Do you have a family history of polyps? yes  Previous colonoscopy with polyps removed? unsure  Do you have a history colorectal cancer?   no  Are you diabetic?  Yes, Type 2  Do you have a prosthetic or mechanical heart valve? no  Do you have a pacemaker/defibrillator?   no  Have you had endocarditis/atrial fibrillation?  no  Do you use supplemental oxygen/CPAP?  no  Have you had joint replacement within the last 12 months?  no  Do you tend to be constipated or have to use laxatives?  no   Do you have history of alcohol use? If yes, how much and how often.  no  Do you have history or are you using drugs? If yes, what do are you  using?  no  Have you ever had a stroke/heart attack?  no  Have you ever had a heart or other vascular stent placed,?no  Do you take weight loss medication? no  Do you take any blood-thinning medications such as: (Plavix, aspirin, Coumadin, Aggrenox, Brilinta, Xarelto, Eliquis, Pradaxa, Savaysa or Effient)? Eliquis   If yes we need the name, milligram, dosage and who is prescribing doctor:  5 mg BID             Current Outpatient Medications  Medication Sig Dispense Refill   albuterol (PROVENTIL) (2.5 MG/3ML) 0.083% nebulizer solution Take 3 mLs (2.5 mg total) by nebulization every 6 (six) hours as needed for wheezing or shortness of breath. 75 mL 0   albuterol (VENTOLIN HFA) 108 (90 Base) MCG/ACT inhaler Inhale 1-2 puffs into the lungs every 6 (six) hours as needed for wheezing or shortness of breath. 3 each 0   apixaban (ELIQUIS) 5 MG TABS tablet Take 1 tablet (5 mg total) by mouth 2 (two) times daily. 180 tablet 3   atorvastatin (LIPITOR) 40 MG tablet Take 1 tablet (40 mg total) by mouth daily. 90 tablet 0   cyclobenzaprine (FLEXERIL) 5 MG tablet Take 1 tablet (5  mg total) by mouth 2 (two) times daily as needed for muscle spasms. 180 tablet 0   fluticasone-salmeterol (ADVAIR) 500-50 MCG/ACT AEPB Inhale 1 puff into the lungs in the morning and at bedtime. 3 each 0   insulin glargine (LANTUS) 100 UNIT/ML injection Inject 75 Units into the skin daily.     levocetirizine (XYZAL) 5 MG tablet Take 5 mg by mouth every evening.     losartan (COZAAR) 25 MG tablet Take 1 tablet (25 mg total) by mouth daily. 90 tablet 0   meclizine (ANTIVERT) 25 MG tablet Take 25 mg by mouth 3 (three) times daily as needed for dizziness or nausea.     metoprolol tartrate (LOPRESSOR) 25 MG tablet Take 1 tablet (25 mg total) by mouth 2 (two) times daily. 180 tablet 0   sitaGLIPtin-metformin (JANUMET) 50-1000 MG tablet Take 1 tablet by mouth 2 (two) times daily with a meal. 180 tablet 0   tamsulosin (FLOMAX) 0.4 MG CAPS capsule Take 1 capsule (0.4 mg total) by mouth daily. (Patient taking differently: Take 0.4 mg by mouth daily. As needed) 30 capsule 4   traMADol (ULTRAM) 50 MG tablet Take 1 tablet (50 mg total) by mouth every 12 (twelve) hours as needed for severe pain. (Patient not taking: Reported on 04/10/2023)  30 tablet 0   No current facility-administered medications for this visit.    Allergies  Allergen Reactions   Penicillins Rash    Has patient had a PCN reaction causing immediate rash, facial/tongue/throat swelling, SOB or lightheadedness with hypotension: Yes Has patient had a PCN reaction causing severe rash involving mucus membranes or skin necrosis: No Has patient had a PCN reaction that required hospitalization No Has patient had a PCN reaction occurring within the last 10 years: No  If all of the above answers are "NO", then may proceed with Cephalosporin use.

## 2023-04-10 NOTE — Telephone Encounter (Signed)
Pt dropped off his questionnaire. Sent to provider for review.

## 2023-04-10 NOTE — Telephone Encounter (Signed)
error 

## 2023-04-19 ENCOUNTER — Emergency Department (HOSPITAL_COMMUNITY)
Admission: EM | Admit: 2023-04-19 | Discharge: 2023-04-19 | Disposition: A | Discharge status: Home / Self Care | Payer: Medicaid Other | Attending: Emergency Medicine | Admitting: Emergency Medicine

## 2023-04-19 ENCOUNTER — Encounter (HOSPITAL_COMMUNITY): Payer: Self-pay | Admitting: *Deleted

## 2023-04-19 ENCOUNTER — Emergency Department (HOSPITAL_COMMUNITY): Payer: Medicaid Other

## 2023-04-19 ENCOUNTER — Other Ambulatory Visit: Payer: Self-pay

## 2023-04-19 DIAGNOSIS — L259 Unspecified contact dermatitis, unspecified cause: Secondary | ICD-10-CM | POA: Diagnosis not present

## 2023-04-19 DIAGNOSIS — R739 Hyperglycemia, unspecified: Secondary | ICD-10-CM

## 2023-04-19 DIAGNOSIS — Z7951 Long term (current) use of inhaled steroids: Secondary | ICD-10-CM | POA: Insufficient documentation

## 2023-04-19 DIAGNOSIS — E119 Type 2 diabetes mellitus without complications: Secondary | ICD-10-CM | POA: Diagnosis not present

## 2023-04-19 DIAGNOSIS — J449 Chronic obstructive pulmonary disease, unspecified: Secondary | ICD-10-CM | POA: Diagnosis not present

## 2023-04-19 DIAGNOSIS — Z79899 Other long term (current) drug therapy: Secondary | ICD-10-CM | POA: Diagnosis not present

## 2023-04-19 DIAGNOSIS — Z7901 Long term (current) use of anticoagulants: Secondary | ICD-10-CM | POA: Diagnosis not present

## 2023-04-19 DIAGNOSIS — R1900 Intra-abdominal and pelvic swelling, mass and lump, unspecified site: Secondary | ICD-10-CM | POA: Diagnosis not present

## 2023-04-19 DIAGNOSIS — I10 Essential (primary) hypertension: Secondary | ICD-10-CM | POA: Insufficient documentation

## 2023-04-19 DIAGNOSIS — R21 Rash and other nonspecific skin eruption: Secondary | ICD-10-CM | POA: Diagnosis present

## 2023-04-19 DIAGNOSIS — Z794 Long term (current) use of insulin: Secondary | ICD-10-CM | POA: Diagnosis not present

## 2023-04-19 LAB — CBC WITH DIFFERENTIAL/PLATELET
Abs Immature Granulocytes: 0.04 10*3/uL (ref 0.00–0.07)
Basophils Absolute: 0.1 10*3/uL (ref 0.0–0.1)
Basophils Relative: 1 %
Eosinophils Absolute: 0.2 10*3/uL (ref 0.0–0.5)
Eosinophils Relative: 2 %
HCT: 42.6 % (ref 39.0–52.0)
Hemoglobin: 14.7 g/dL (ref 13.0–17.0)
Immature Granulocytes: 0 %
Lymphocytes Relative: 22 %
Lymphs Abs: 2.1 10*3/uL (ref 0.7–4.0)
MCH: 29 pg (ref 26.0–34.0)
MCHC: 34.5 g/dL (ref 30.0–36.0)
MCV: 84 fL (ref 80.0–100.0)
Monocytes Absolute: 0.6 10*3/uL (ref 0.1–1.0)
Monocytes Relative: 7 %
Neutro Abs: 6.4 10*3/uL (ref 1.7–7.7)
Neutrophils Relative %: 68 %
Platelets: 190 10*3/uL (ref 150–400)
RBC: 5.07 MIL/uL (ref 4.22–5.81)
RDW: 13.1 % (ref 11.5–15.5)
WBC: 9.4 10*3/uL (ref 4.0–10.5)
nRBC: 0 % (ref 0.0–0.2)

## 2023-04-19 LAB — COMPREHENSIVE METABOLIC PANEL
ALT: 18 U/L (ref 0–44)
AST: 17 U/L (ref 15–41)
Albumin: 4.1 g/dL (ref 3.5–5.0)
Alkaline Phosphatase: 54 U/L (ref 38–126)
Anion gap: 9 (ref 5–15)
BUN: 20 mg/dL (ref 8–23)
CO2: 23 mmol/L (ref 22–32)
Calcium: 9.2 mg/dL (ref 8.9–10.3)
Chloride: 100 mmol/L (ref 98–111)
Creatinine, Ser: 0.88 mg/dL (ref 0.61–1.24)
GFR, Estimated: >60 mL/min (ref >60)
Glucose, Bld: 395 mg/dL — ABNORMAL HIGH (ref 70–99)
Potassium: 3.9 mmol/L (ref 3.5–5.1)
Sodium: 132 mmol/L — ABNORMAL LOW (ref 135–145)
Total Bilirubin: 1.2 mg/dL (ref 0.3–1.2)
Total Protein: 7.2 g/dL (ref 6.5–8.1)

## 2023-04-19 LAB — LIPASE, BLOOD: Lipase: 39 U/L (ref 11–51)

## 2023-04-19 LAB — CBG MONITORING, ED: Glucose-Capillary: 385 mg/dL — ABNORMAL HIGH (ref 70–99)

## 2023-04-19 MED ORDER — IOHEXOL 300 MG/ML  SOLN
100.0000 mL | Freq: Once | INTRAMUSCULAR | Status: AC | PRN
  Administered 2023-04-19: 100 mL via INTRAVENOUS

## 2023-04-19 NOTE — Discharge Instructions (Addendum)
For the liver lesion they are recommending MRI.  Can follow-up with your primary care doctor as well as gastroenterology.  Sounds like you are plugged in.  But I gave you who is on call if things get mixed up.  Either primary care doctor or gastroenterology could order the MRI of the liver.  Blood sugar elevated here recommend you follow back up with your primary care doctor to make sure that that improves.  Return for any new or worse symptoms.  CT scan of the abdomen did not give an explanation for distention of the abdomen.  For the rash on the right ankle anterior part of the foot would recommend hydrocortisone cream 1% would apply it 3 times a day.  Work note provided.

## 2023-04-19 NOTE — ED Triage Notes (Signed)
Pt c/o abdominal swelling and bilateral feet swelling that started this am when he woke up; pt denies any sob or pain

## 2023-04-19 NOTE — ED Provider Notes (Addendum)
[name] AT [facility] Provider Note   CSN: [id number] Arrival date & time: 04/19/23  1013     History  Chief Complaint  Patient presents with   Leg Swelling    Gary Burke is a 62 y.o. male.  Patient with a complaint of a rash to his right foot and says Gary Burke is got bilateral feet swelling.  But no apparent edema.  The rash kind of burns is not itchy.  Patient states also that his abdomen swelled up this morning.  Is never had that before.  Denies any nausea vomiting or diarrhea or any shortness of breath or chest pain.  Patient has a history of diabetes.  And patient has had a history of arthritis.  Past medical history significant for bipolar disorder hypertension diabetes history of pulmonary embolism in the past COPD and sleep apnea had appendectomy at age 44.  Patient is on Eliquis.       Home Medications Prior to Admission medications   Medication Sig Start Date End Date Taking? Authorizing Provider  albuterol (PROVENTIL) (2.5 MG/3ML) 0.083% nebulizer solution Take 3 mLs (2.5 mg total) by nebulization every 6 (six) hours as needed for wheezing or shortness of breath. 10/16/22   Jacquelin Hawking, PA-C  albuterol (VENTOLIN HFA) 108 (90 Base) MCG/ACT inhaler Inhale 1-2 puffs into the lungs every 6 (six) hours as needed for wheezing or shortness of breath. 10/16/22   Jacquelin Hawking, PA-C  apixaban (ELIQUIS) 5 MG TABS tablet Take 1 tablet (5 mg total) by mouth 2 (two) times daily. 10/16/22   Jacquelin Hawking, PA-C  atorvastatin (LIPITOR) 40 MG tablet Take 1 tablet (40 mg total) by mouth daily. 10/16/22   Jacquelin Hawking, PA-C  cyclobenzaprine (FLEXERIL) 5 MG tablet Take 1 tablet (5 mg total) by mouth 2 (two) times daily as needed for muscle spasms. 11/14/22   Jacquelin Hawking, PA-C  fluticasone-salmeterol (ADVAIR) 500-50 MCG/ACT AEPB Inhale 1 puff into the lungs in the morning and at bedtime. 10/16/22   Jacquelin Hawking, PA-C  insulin glargine  (LANTUS) 100 UNIT/ML injection Inject 75 Units into the skin daily.    [provider]  levocetirizine (XYZAL) 5 MG tablet Take 5 mg by mouth every evening.    [provider]  losartan (COZAAR) 25 MG tablet Take 1 tablet (25 mg total) by mouth daily. 10/16/22   Jacquelin Hawking, PA-C  meclizine (ANTIVERT) 25 MG tablet Take 25 mg by mouth 3 (three) times daily as needed for dizziness or nausea. 07/11/22   [provider]  metoprolol tartrate (LOPRESSOR) 25 MG tablet Take 1 tablet (25 mg total) by mouth 2 (two) times daily. 10/16/22   Jacquelin Hawking, PA-C  sitaGLIPtin-metformin (JANUMET) 50-1000 MG tablet Take 1 tablet by mouth 2 (two) times daily with a meal. 11/14/22   Jacquelin Hawking, PA-C  tamsulosin (FLOMAX) 0.4 MG CAPS capsule Take 1 capsule (0.4 mg total) by mouth daily. Patient taking differently: Take 0.4 mg by mouth daily. As needed 10/16/22   Jacquelin Hawking, PA-C  traMADol (ULTRAM) 50 MG tablet Take 1 tablet (50 mg total) by mouth every 12 (twelve) hours as needed for severe pain. Patient not taking: Reported on 04/10/2023 01/17/23   Oliver Barre, MD      Allergies    Penicillins    Review of Systems   Review of Systems  Constitutional:  Negative for chills and fever.  HENT:  Negative for ear pain and sore throat.   Eyes:  Negative  for pain and visual disturbance.  Respiratory:  Negative for cough and shortness of breath.   Cardiovascular:  Negative for chest pain and palpitations.  Gastrointestinal:  Positive for abdominal distention. Negative for abdominal pain and vomiting.  Genitourinary:  Negative for dysuria and hematuria.  Musculoskeletal:  Negative for arthralgias and back pain.  Skin:  Positive for rash. Negative for color change.  Neurological:  Negative for seizures and syncope.  All other systems reviewed and are negative.   Physical Exam Updated Vital Signs BP (!) 129/94 (BP Location: Right Arm)   Pulse 82   Temp 97.6 F (36.4 C)  (Oral)   Resp 16   Ht 1.829 m (6')   Wt 127 kg   SpO2 94%   BMI 37.97 kg/m  Physical Exam Vitals and nursing note reviewed.  Constitutional:      General: Gary Burke is not in acute distress.    Appearance: Normal appearance. Gary Burke is well-developed.  HENT:     Head: Normocephalic and atraumatic.  Eyes:     Extraocular Movements: Extraocular movements intact.     Conjunctiva/sclera: Conjunctivae normal.     Pupils: Pupils are equal, round, and reactive to light.  Cardiovascular:     Rate and Rhythm: Normal rate and regular rhythm.     Heart sounds: No murmur heard. Pulmonary:     Effort: Pulmonary effort is normal. No respiratory distress.     Breath sounds: Normal breath sounds.  Abdominal:     General: There is distension.     Palpations: Abdomen is soft.     Tenderness: There is no abdominal tenderness. There is no guarding.  Musculoskeletal:        General: No swelling.     Cervical back: Normal range of motion and neck supple.     Comments: No real swelling to the feet.  The anterior aspect of the right foot has a papular and a little bit vesicular rash.  Not extensive like herpes zoster.  But could be consistent with a contact dermatitis.  Skin:    General: Skin is warm and dry.     Capillary Refill: Capillary refill takes less than 2 seconds.  Neurological:     General: No focal deficit present.     Mental Status: Gary Burke is alert and oriented to person, place, and time.  Psychiatric:        Mood and Affect: Mood normal.     ED Results / Procedures / Treatments   Labs (all labs ordered are listed, but only abnormal results are displayed) Labs Reviewed  CBC WITH DIFFERENTIAL/PLATELET  COMPREHENSIVE METABOLIC PANEL  LIPASE, BLOOD    EKG None  Radiology No results found.  Procedures Procedures    Medications Ordered in ED Medications - No data to display  ED Course/ Medical Decision Making/ A&P                             Medical Decision Making Amount and/or  Complexity of Data Reviewed Labs: ordered. Radiology: ordered.  Risk Prescription drug management.   Patient's abdomen is soft and nontender.  Will get CT scan because Gary Burke is convinced that it is very swollen.  Will get abdominal type labs as well.  The rash on the right anterior foot seems to be consistent with a contact dermatitis will probably treat that with hydrocortisone cream.  His main concern was the abdominal swelling.  CT scan of the abdomen without any  acute findings however there is evidence of a concern in the liver that we will need follow-up in the recommending MRI.  There is an area of 4 cm subtle decreased density in the left lobe of the liver suggesting possible focal fatty infiltration or hemangioma or some other space-occupying lesion.  Patient has follow-up pending with GI medicine and also can go back to his primary care to have an MRI arranged.  Patient's blood sugars elevated here.  Gary Burke has his medicines.  No signs of any DKA.  Patient's liver function test are normal.  CT showed no explanation for the abdominal distention.  And clinically does not seem to be significantly distended.   Final Clinical Impression(s) / ED Diagnoses Final diagnoses:  Abdominal swelling  Contact dermatitis, unspecified contact dermatitis type, unspecified trigger    Rx / DC Orders ED Discharge Orders     None         Vanetta Mulders, MD 04/19/23 1118    Vanetta Mulders, MD 04/19/23 1400

## 2023-05-03 ENCOUNTER — Encounter: Payer: Self-pay | Admitting: Gastroenterology

## 2023-05-03 ENCOUNTER — Telehealth: Payer: Self-pay | Admitting: *Deleted

## 2023-05-03 ENCOUNTER — Ambulatory Visit (INDEPENDENT_AMBULATORY_CARE_PROVIDER_SITE_OTHER): Payer: Medicaid Other | Admitting: Gastroenterology

## 2023-05-03 ENCOUNTER — Telehealth: Payer: Self-pay | Admitting: Gastroenterology

## 2023-05-03 VITALS — BP 127/83 | HR 65 | Temp 97.5°F | Ht 72.0 in | Wt 260.2 lb

## 2023-05-03 DIAGNOSIS — K769 Liver disease, unspecified: Secondary | ICD-10-CM

## 2023-05-03 DIAGNOSIS — Z86010 Personal history of colonic polyps: Secondary | ICD-10-CM

## 2023-05-03 DIAGNOSIS — R932 Abnormal findings on diagnostic imaging of liver and biliary tract: Secondary | ICD-10-CM

## 2023-05-03 NOTE — Progress Notes (Signed)
GI Office Note    Referring Provider: Wilmon Pali, FNP Primary Care Physician:  Wilmon Pali, FNP   Primary Gastroenterologist: Roetta Sessions, MD   Chief Complaint   Chief Complaint  Patient presents with   Colonoscopy     History of Present Illness   Gary Burke is a 62 y.o. male presenting today at the request of Coral Ceo, FNP for colonoscopy.   He has history of adenomatous colon polyps on several colonoscopies done in [place]. His last colonoscopy was two years ago at which time he had 5 polyps removed with several tubular adenomas. No FH colon cancer. He reports personal history of pulmonary emboli twice, he states reported once while in [place] several years ago and then once at [facility] (I don't seen any imaging at [facility]). He was on Xarelto for years but due to insurance coverage he was switched to Eliquis. He believes he saw hematologist while in [place]. He really is unsure as to the source of the PEs. Review of one pulmonology note in 2016 states they felt like he did not have PE on one of the concerning studies. Thought it was shadowing. States he typically holds his anticoagulation for colonoscopy. Did not tolerate gallon bowel prep in the past.   From GI standpoint, BM 2 per day. Usually after taking Janumet. No melena, brbpr. Appetite good. No abdominal pain. Recently seen in the ED for swelling and CT completed as outlined below. Has liver abnormality and needs MRI. No heartburn, dysphagia. Sometimes belches smell like sulfur. Currently not on Ozempic but getting ready to start back. Had to get approved with insurance.      CT abdomen pelvis with contrast June 2024: IMPRESSION: -There is no evidence of intestinal obstruction or pneumoperitoneum. -There is no hydronephrosis. -There is 4 cm area of subtle decreased density in the left lobe of liver suggesting possible focal fatty infiltration or hemangioma or some other space-occupying lesion. Nonemergent follow-up MRI  may be considered. -Right renal cyst.  Coronary artery disease.  Lumbar spondylosis. -Other findings as described in the body of the report.   Colonoscopy August 2022: [facility] -2 sessile polyps, small, removed from ascending colon, tubular adenomas -3 sessile polyps, small, removed from the sigmoid colon, hyperplastic -Few small mouth diverticula in the sigmoid colon   Colonoscopy August 2019: [facility] -1 diminutive polyp in the cecum removed, tubular adenoma -Medium polyp in the sigmoid colon removed, tubular adenoma  Medications   Current Outpatient Medications  Medication Sig Dispense Refill   albuterol (PROVENTIL) (2.5 MG/3ML) 0.083% nebulizer solution Take 3 mLs (2.5 mg total) by nebulization every 6 (six) hours as needed for wheezing or shortness of breath. 75 mL 0   albuterol (VENTOLIN HFA) 108 (90 Base) MCG/ACT inhaler Inhale 1-2 puffs into the lungs every 6 (six) hours as needed for wheezing or shortness of breath. 3 each 0   apixaban (ELIQUIS) 5 MG TABS tablet Take 1 tablet (5 mg total) by mouth 2 (two) times daily. 180 tablet 3   atorvastatin (LIPITOR) 40 MG tablet Take 1 tablet (40 mg total) by mouth daily. 90 tablet 0   fenofibrate (TRICOR) 145 MG tablet Take 145 mg by mouth daily.     fluticasone-salmeterol (ADVAIR) 500-50 MCG/ACT AEPB Inhale 1 puff into the lungs in the morning and at bedtime. 3 each 0   hydrocortisone 2.5 % cream Apply topically 3 (three) times daily.     insulin glargine (LANTUS) 100 UNIT/ML injection  Inject 75 Units into the skin daily.     levocetirizine (XYZAL) 5 MG tablet Take 5 mg by mouth every evening.     losartan (COZAAR) 25 MG tablet Take 1 tablet (25 mg total) by mouth daily. 90 tablet 0   meclizine (ANTIVERT) 25 MG tablet Take 25 mg by mouth 3 (three) times daily as needed for dizziness or nausea.     metoprolol tartrate (LOPRESSOR) 25 MG tablet Take 1 tablet (25 mg total) by mouth 2 (two) times daily. 180 tablet 0   OZEMPIC,  0.25 OR 0.5 MG/DOSE, 2 MG/3ML SOPN Inject into the skin.     pantoprazole (PROTONIX) 40 MG tablet Take 40 mg by mouth daily.     sitaGLIPtin-metformin (JANUMET) 50-1000 MG tablet Take 1 tablet by mouth 2 (two) times daily with a meal. 180 tablet 0   tamsulosin (FLOMAX) 0.4 MG CAPS capsule Take 1 capsule (0.4 mg total) by mouth daily. (Patient taking differently: Take 0.4 mg by mouth daily. As needed) 30 capsule 4   No current facility-administered medications for this visit.    Allergies   Allergies as of 05/03/2023 - Review Complete 05/03/2023  Allergen Reaction Noted   Penicillins Rash 07/08/2012    Past Medical History   Past Medical History:  Diagnosis Date   Bipolar disorder (HCC)    COPD (chronic obstructive pulmonary disease) (HCC)    Diabetes mellitus without complication (HCC) 04/2016   Hypertension    Pulmonary embolism (HCC)    L lung x2   Sleep apnea     Past Surgical History   Past Surgical History:  Procedure Laterality Date   APPENDECTOMY  age 66    Past Family History   Family History  Problem Relation Age of Onset   Diabetes Daughter        Diabetes Type 1   Colon cancer Neg Hx     Past Social History   Social History   Socioeconomic History   Marital status: Married    Spouse name: Not on file   Number of children: Not on file   Years of education: Not on file   Highest education level: Not on file  Occupational History   Not on file  Tobacco Use   Smoking status: Former    Packs/day: 0.50    Years: 37.00    Additional pack years: 0.00    Total pack years: 18.50    Types: Cigarettes    Quit date: 10/29/2014    Years since quitting: 8.5   Smokeless tobacco: Never  Substance and Sexual Activity   Alcohol use: No   Drug use: No   Sexual activity: Yes    Birth control/protection: None  Other Topics Concern   Not on file  Social History Narrative   Not on file   Social Determinants of Health   Financial Resource Strain: Not on  file  Food Insecurity: Not on file  Transportation Needs: Not on file  Physical Activity: Not on file  Stress: Not on file  Social Connections: Not on file  Intimate Partner Violence: Not on file    Review of Systems   General: Negative for anorexia, weight loss, fever, chills, fatigue, weakness. Eyes: Negative for vision changes.  ENT: Negative for hoarseness, difficulty swallowing , nasal congestion. CV: Negative for chest pain, angina, palpitations, dyspnea on exertion, peripheral edema.  Respiratory: Negative for dyspnea at rest, dyspnea on exertion, cough, sputum, wheezing.  GI: See history of present illness. GU:  Negative  for dysuria, hematuria, urinary incontinence, urinary frequency, nocturnal urination.  MS: knee pain, receives hydrocortisone shots. Negative for low back pain.  Derm: Negative for rash or itching.  Neuro: Negative for weakness, abnormal sensation, seizure, frequent headaches, memory loss,  confusion.  Psych: Negative for anxiety, depression, suicidal ideation, hallucinations.  Endo: Negative for unusual weight change.  Heme: Negative for bruising or bleeding. Allergy: Negative for rash or hives.  Physical Exam   BP 127/83 (BP Location: Right Arm, Patient Position: Sitting, Cuff Size: Large)   Pulse 65   Temp (!) 97.5 F (36.4 C) (Oral)   Ht 6' (1.829 m)   Wt 260 lb 3.2 oz (118 kg)   SpO2 94%   BMI 35.29 kg/m    General: Well-nourished, well-developed in no acute distress.  Head: Normocephalic, atraumatic.   Eyes: Conjunctiva pink, no icterus. Mouth: Oropharyngeal mucosa moist and pink   Neck: Supple without thyromegaly, masses, or lymphadenopathy.  Lungs: Clear to auscultation bilaterally.  Heart: Regular rate and rhythm, no murmurs rubs or gallops.  Abdomen: Bowel sounds are normal, nontender, nondistended, no hepatosplenomegaly or masses,  no abdominal bruits or hernia, no rebound or guarding.   Rectal: not performed Extremities: No lower  extremity edema. No clubbing or deformities.  Neuro: Alert and oriented x 4 , grossly normal neurologically.  Skin: Warm and dry, no rash or jaundice.   Psych: Alert and cooperative, normal mood and affect.  Labs   Lab Results  Component Value Date   CREATININE 0.88 04/19/2023   BUN 20 04/19/2023   NA 132 (L) 04/19/2023   K 3.9 04/19/2023   CL 100 04/19/2023   CO2 23 04/19/2023   Lab Results  Component Value Date   ALT 18 04/19/2023   AST 17 04/19/2023   ALKPHOS 54 04/19/2023   BILITOT 1.2 04/19/2023   Lab Results  Component Value Date   LIPASE 39 04/19/2023   Lab Results  Component Value Date   WBC 9.4 04/19/2023   HGB 14.7 04/19/2023   HCT 42.6 04/19/2023   MCV 84.0 04/19/2023   PLT 190 04/19/2023    Imaging Studies   CT ABDOMEN PELVIS W CONTRAST  Result Date: 04/19/2023 CLINICAL DATA:  Abdominal pain EXAM: CT ABDOMEN AND PELVIS WITH CONTRAST TECHNIQUE: Multidetector CT imaging of the abdomen and pelvis was performed using the standard protocol following bolus administration of intravenous contrast. RADIATION DOSE REDUCTION: This exam was performed according to the departmental dose-optimization program which includes automated exposure control, adjustment of the mA and/or kV according to patient size and/or use of iterative reconstruction technique. CONTRAST:  OMNIPAQUE IOHEXOL 300 MG/ML  SOLN COMPARISON:  None Available. FINDINGS: Lower chest: Breathing motion limits evaluation of lower lung fields. As far as seen, no focal pulmonary consolidation is noted. Small coronary artery calcifications are seen. Hepatobiliary: There is no dilation of bile ducts. There is a 4 cm area of subtle decreased density in the left lobe of liver. Images of previous CT chest done on 06/16/2016 are not available for comparison. Gallbladder is unremarkable. Pancreas: No focal abnormalities are seen. Spleen: There are small scattered calcifications in spleen. Adrenals/Urinary Tract:  Adrenals are unremarkable. There is no hydronephrosis. There is 3.9 cm smooth marginated fluid density structure in the anterior aspect of right kidney suggesting renal cysts. There are no renal or ureteral stones. Urinary bladder is unremarkable. Stomach/Bowel: Stomach is unremarkable. Small bowel loops are not dilated. The appendix is difficult to visualize. In image 43 of series 2, there  is a small caliber tubular structure with air in the lumen suggesting possible appendix. There is no pericecal inflammation. There is no significant wall thickening and colon. Vascular/Lymphatic: Scattered arterial calcifications are seen. There is subtle increased density in mesenteric fat, possibly normal variation or chronic mild mesenteric inflammation. There are no fluid collections in the mesentery. Subcentimeter nodes are seen suggesting possible reactive hyperplasia. Reproductive: There are small calcifications in prostate. Other: There is no ascites or pneumoperitoneum. Umbilical hernia containing fat is seen. Bilateral inguinal hernias containing fat are seen. Musculoskeletal: Degenerative changes are noted in lower thoracic spine and lumbar spine. There is spinal stenosis at L4-L5 level. There is narrowing of neural foramina at multiple levels. IMPRESSION: There is no evidence of intestinal obstruction or pneumoperitoneum. There is no hydronephrosis. There is 4 cm area of subtle decreased density in the left lobe of liver suggesting possible focal fatty infiltration or hemangioma or some other space-occupying lesion. Nonemergent follow-up MRI may be considered. Right renal cyst.  Coronary artery disease.  Lumbar spondylosis. Other findings as described in the body of the report. Electronically Signed   By: Ernie Avena M.D.   On: 04/19/2023 12:57    Assessment   Abnormal liver on recent CT: possible focal fatty infiltration in the left lobe of liver, measuring 4cm. Needs MRI to further evaluate.   H/o  adenomatous colon polyps: [place], each time having several polyps. Discussed with Dr. Jena Gauss, would recommend updated colonoscopy at this time. Will hold Eliquis 48 hours before.    PLAN   MRI Abd with and without contrast.  Colonoscopy with Dr. Jena Gauss. ASA 3. Hold Eliquis 48 hours before.  I have discussed the risks, alternatives, benefits with regards to but not limited to the risk of reaction to medication, bleeding, infection, perforation and the patient is agreeable to proceed. Written consent to be obtained. Hold ozempic one week before colonoscopy.   Leanna Battles. Melvyn Neth, MHS, PA-C [facility]

## 2023-05-03 NOTE — Telephone Encounter (Signed)
Please schedule colonoscopy with Dr. Jena Gauss. ASA 3. Dx: h/o colon polyps.  Hold ozempic 7 days before.  Hold eliquis 48 hours before, request approval from pcp. Day of prep: lantus 35 units daily, janumet am only. Day of colon: hold lantus and janumet  Avoid gallon prep.

## 2023-05-03 NOTE — Patient Instructions (Signed)
MRI liver planned to evaluate findings seen on recent CT scan. We will be in touch to schedule. I will talk with Dr. Jena Gauss regarding timing of next colonoscopy. We will be in touch next week.   It was a pleasure to see you today. I truly value your feedback, so please be on the lookout for a survey regarding your visit with me today. I appreciate your time in completing this!

## 2023-05-03 NOTE — Telephone Encounter (Signed)
[employer] PA MRI: Approved. CPT Code 40981 Description: MRI ABDOMEN W & W/O CONTRAST Authorization Number: [id number] Case Number: [id number] Review Date: 05/03/2023 11:10:37 AM Expiration Date: 06/17/2023 Status: Your case has been Approved

## 2023-05-06 ENCOUNTER — Encounter: Payer: Self-pay | Admitting: Nutrition

## 2023-05-06 ENCOUNTER — Encounter: Payer: Medicaid Other | Attending: Family Medicine | Admitting: Nutrition

## 2023-05-06 VITALS — Ht 72.0 in | Wt 260.0 lb

## 2023-05-06 DIAGNOSIS — E782 Mixed hyperlipidemia: Secondary | ICD-10-CM | POA: Diagnosis present

## 2023-05-06 DIAGNOSIS — Z6841 Body Mass Index (BMI) 40.0 and over, adult: Secondary | ICD-10-CM | POA: Diagnosis present

## 2023-05-06 DIAGNOSIS — I10 Essential (primary) hypertension: Secondary | ICD-10-CM | POA: Insufficient documentation

## 2023-05-06 DIAGNOSIS — E118 Type 2 diabetes mellitus with unspecified complications: Secondary | ICD-10-CM | POA: Diagnosis present

## 2023-05-06 NOTE — Telephone Encounter (Signed)
Will schedule once get providers August schedule.

## 2023-05-06 NOTE — Patient Instructions (Signed)
Goals  Eat three meals per day at times as discussed. Increased fruits, vegetables and whole grains. Cut out fried and processed foods Take medications on time Test blood sugars 4 times  a day Call Ms. Collins and make an appt in 2 weeks to make adjustments in medications Call and ask to order a libre. Drink a gallon of water per day Walk 30 minutes a day.

## 2023-05-06 NOTE — Telephone Encounter (Signed)
Will call once we have schedule

## 2023-05-06 NOTE — Progress Notes (Signed)
Medical Nutrition Therapy  Appointment Start time:  0930  Appointment End time:  1030  Primary concerns today: Dm Type 2  Referral diagnosis: E11.8 Preferred learning style: No Preference Learning readiness: Ready    NUTRITION ASSESSMENT  62 yr old wmale here with his wife for uncontrolled Type 2 DM. PMH: HTN, PE, Aneurysm of ascending aorta, COPD, Obesity, Hyperlipidemia. New provider is Coral Ceo at [facility].  Checking his blood sugars twice a day BS have been 250's 300's most of the time. BS are highest in am and before bed. Lanuts 75 unit, Ozempic .25 started  this past weekend. 75 units of Lantus. Is also on Janumet.  Hasn't been testing twice a day consistently.  Use to have a Libre given by his old PCP, but only had it for 2 weeks. He would greatly benefit from a CGM to better controll his BS.  He is willing to work on eating better and changing his  lifestyle to more whole plant based food and getting in more regular exercise. He notes his A1C was as high as 14% in January. He thinks it was 9% or so at last MD visit a few weeks ago at [facility].  He denies missing any doses of insulin.  His wife is supportive of the need for lifestyle changes.  Anthropometrics  Wt Readings from Last 3 Encounters:  05/03/23 260 lb 3.2 oz (118 kg)  04/19/23 280 lb (127 kg)  11/20/22 260 lb (117.9 kg)   Ht Readings from Last 3 Encounters:  05/03/23 6' (1.829 m)  04/19/23 6' (1.829 m)  11/20/22 5' 11.5" (1.816 m)   There is no height or weight on file to calculate BMI. @BMIFA @ Facility age limit for growth %iles is 20 years. Facility age limit for growth %iles is 20 years.    Clinical Medical Hx: see chart Medications: Lantus 75 units, Januvia Labs:  Lab Results  Component Value Date   HGBA1C 14.2 (H) 11/13/2022    Notable Signs/Symptoms: Increased thirst, fatigue, blurry vision, weight loss unplanned.  Lifestyle & Dietary Hx Lives with his wife.   Has been working on eating better foods and salad.s FBS PCP GIna Collins FNP   Estimated daily fluid intake: 80 oz Supplements:  Sleep: varies Stress / self-care:  Current average weekly physical activity: walks his dog  24-Hr Dietary Recall Eats 2-3 times per day; snacks some, skips meals some  Estimated Energy Needs Calories: 1800 Carbohydrate: 200g Protein: 135g Fat: 50g   NUTRITION DIAGNOSIS  NB-1.1 Food and nutrition-related knowledge deficit As related to DIabetes Type 2.  As evidenced by A1C > 14%..   NUTRITION INTERVENTION  Nutrition education (E-1) on the following topics:  Nutrition and Diabetes education provided on My Plate, CHO counting, meal planning, portion sizes, timing of meals, avoiding snacks between meals unless having a low blood sugar, target ranges for A1C and blood sugars, signs/symptoms and treatment of hyper/hypoglycemia, monitoring blood sugars, taking medications as prescribed, benefits of exercising 30 minutes per day and prevention of complications of DM.  Lifestyle Medicine  - Whole Food, Plant Predominant Nutrition is highly recommended: Eat Plenty of vegetables, Mushrooms, fruits, Legumes, Whole Grains, Nuts, seeds in lieu of processed meats, processed snacks/pastries red meat, poultry, eggs.    -It is better to avoid simple carbohydrates including: Cakes, Sweet Desserts, Ice Cream, Soda (diet and regular), Sweet Tea, Candies, Chips, Cookies, Store Bought Juices, Alcohol in Excess of  1-2 drinks a day, Lemonade,  Artificial Sweeteners,  Doughnuts, Coffee Creamers, "Sugar-free" Products, etc, etc.  This is not a complete list.....  Exercise: If you are able: 30 -60 minutes a day ,4 days a week, or 150 minutes a week.  The longer the better.  Combine stretch, strength, and aerobic activities.  If you were told in the past that you have high risk for cardiovascular diseases, you may seek evaluation by your heart doctor prior to initiating moderate to  intense exercise programs.    Handouts Provided Include  Lifesyle Medicine Goals for BS's Log sheet  Learning Style & Readiness for Change Teaching method utilized: Visual & Auditory  Demonstrated degree of understanding via: Teach Back  Barriers to learning/adherence to lifestyle change: None  Goals Established by Pt Goals  Eat three meals per day at times as discussed. Increased fruits, vegetables and whole grains. Cut out fried and processed foods Take medications on time Test blood sugars 4 times  a day Call Ms. Collins and make an appt in 2 weeks to make adjustments in medications Call and ask to order a libre. Drink a gallon of water per day Walk 30 minutes a day.   MONITORING & EVALUATION Dietary intake, weekly physical activity, and blood sugars in 1 week.  Next Steps  Patient is to keep a food journal and eat 3 meals per day as instructed.Marland Kitchen

## 2023-05-13 ENCOUNTER — Ambulatory Visit: Payer: Medicaid Other | Admitting: Nutrition

## 2023-05-14 ENCOUNTER — Encounter: Payer: Self-pay | Admitting: Orthopedic Surgery

## 2023-05-14 ENCOUNTER — Ambulatory Visit: Payer: Medicaid Other | Admitting: Orthopedic Surgery

## 2023-05-14 VITALS — BP 125/84 | HR 88 | Ht 72.0 in | Wt 262.0 lb

## 2023-05-14 DIAGNOSIS — M1712 Unilateral primary osteoarthritis, left knee: Secondary | ICD-10-CM | POA: Diagnosis not present

## 2023-05-14 DIAGNOSIS — M1711 Unilateral primary osteoarthritis, right knee: Secondary | ICD-10-CM

## 2023-05-14 NOTE — Progress Notes (Signed)
Return patient Visit  Assessment: Gary Burke is a 62 y.o. male with the following: 1.  Bilateral knee arthritis  Plan: SEBRON MCMAHILL has moderate degenerative changes in bilateral knees.  Previous injections have been effective.  He is about to start a new job, and will be standing part-time.  He is interested in repeat injections.  Injections were completed in clinic today.  He will follow-up as needed.  Procedure note injection Right knee joint   Verbal consent was obtained to inject the right knee joint  Timeout was completed to confirm the site of injection.  The skin was prepped with alcohol and ethyl chloride was sprayed at the injection site.  A 21-gauge needle was used to inject 40 mg of Depo-Medrol and 1% lidocaine (3 cc) into the right knee using an anterolateral approach.  There were no complications. A sterile bandage was applied.   Procedure note injection Left knee joint   Verbal consent was obtained to inject the left knee joint  Timeout was completed to confirm the site of injection.  The skin was prepped with alcohol and ethyl chloride was sprayed at the injection site.  A 21-gauge needle was used to inject 40 mg of Depo-Medrol and 1% lidocaine (3 cc) into the left knee using an anterolateral approach.  There were no complications. A sterile bandage was applied.     Follow-up: No follow-ups on file.  Subjective:  Chief Complaint  Patient presents with   Knee Pain    Bilat knee pain just started 1 wk ago, pt states he's about to stat working part time and wants.     History of Present Illness: Gary Burke is a 62 y.o. male who returns to clinic for repeat evaluation of bilateral knees.  I saw him in clinic approximately 3 months ago.  At that time, we injected both knees.  He states that it was very effective.  Recently, he started develop some more discomfort.  He is scheduled to start a new job next week.  He was standing throughout the day, part-time.  He  would like to proceed with knee injections.   Review of Systems: No fevers or chills No numbness or tingling No chest pain No shortness of breath No bowel or bladder dysfunction No GI distress No headaches   Objective: BP 125/84   Pulse 88   Ht 6' (1.829 m)   Wt 262 lb (118.8 kg)   BMI 35.53 kg/m   Physical Exam:  General: Alert and oriented. and No acute distress. Gait: Right sided antalgic gait.  Bilateral knees without effusion.  Tenderness to palpation in the medial knee.  Mild varus alignment clinically.  This does react, with a little bit of laxity.  Negative Lachman bilaterally.  IMAGING: I personally reviewed images previously obtained in clinic  X-rays of both knees were previously obtained.  Mild degenerative changes are noted.  Moderate loss of joint space within the medial compartments bilaterally.  There are associated osteophytes.  Mild varus alignment.   New Medications:  No orders of the defined types were placed in this encounter.     Oliver Barre, MD  05/14/2023 3:06 PM

## 2023-05-14 NOTE — Patient Instructions (Signed)
 Instructions Following Joint Injections  In clinic today, you received an injection in one of your joints (sometimes more than one).  Occasionally, you can have some pain at the injection site, this is normal.  You can place ice at the injection site, or take over-the-counter medications such as Tylenol (acetaminophen) or Advil (ibuprofen).  Please follow all directions listed on the bottle.  If your joint (knee or shoulder) becomes swollen, red or very painful, please contact the clinic for additional assistance.   Two medications were injected, including lidocaine and a steroid (often referred to as cortisone).  Lidocaine is effective almost immediately but wears off quickly.  However, the steroid can take a few days to improve your symptoms.  In some cases, it can make your pain worse for a couple of days.  Do not be concerned if this happens as it is common.  You can apply ice or take some over-the-counter medications as needed.   

## 2023-05-15 ENCOUNTER — Other Ambulatory Visit: Payer: Self-pay | Admitting: *Deleted

## 2023-05-15 MED ORDER — CLENPIQ 10-3.5-12 MG-GM -GM/175ML PO SOLN: 1.0000 | ORAL | 0 refills | Status: DC

## 2023-05-15 NOTE — Telephone Encounter (Signed)
Called pt to schedule procedure. He has been scheduled for 8/15 at 11am. Aware will send instructions/pre-op appt to him. Advised medications to be held/when.   Don't see approval for eliquis to be held. Sending to PCP. Please advise if okay to hold Eliquis x 2 days prior to procedure? Thanks

## 2023-05-21 ENCOUNTER — Encounter: Payer: Self-pay | Admitting: *Deleted

## 2023-05-21 NOTE — Telephone Encounter (Signed)
Clearance received to hold eliquis and scanned into media

## 2023-05-23 NOTE — Telephone Encounter (Signed)
Noted ok to schedule

## 2023-05-29 ENCOUNTER — Ambulatory Visit (HOSPITAL_COMMUNITY): Payer: Medicaid Other

## 2023-06-06 NOTE — Patient Instructions (Signed)
Gary Burke  06/06/2023     @PREFPERIOPPHARMACY @   Your procedure is scheduled on  06/13/2023.   Report to [facility] at  0900  A.M.   Call this number if you have problems the morning of surgery:  [phone]  If you experience any cold or flu symptoms such as cough, fever, chills, shortness of breath, etc. between now and your scheduled surgery, please notify us at the above number.   Remember:  Follow the diet and prep instructions given to you by the office.       Your last dose of ozempic should be on 06/05/2023.       Your last dose of janumet should be on 06/09/2023.       Your last dose of eliquis should be on 06/10/2023.       Take 1/2 of your usual night time insulin the night before your procedure.      Use your nebulizer and your inhaler before you come and bring your rescue inhaler with you.        Take these medicines the morning of surgery with A SIP OF WATER                      metoprolol, pantoprazole, flomax.     Do not wear jewelry, make-up or nail polish, including gel polish,  artificial nails, or any other type of covering on natural nails (fingers and  toes).  Do not wear lotions, powders, or perfumes, or deodorant.  Do not shave 48 hours prior to surgery.  Men may shave face and neck.  Do not bring valuables to the hospital.  [facility] is not responsible for any belongings or valuables.  Contacts, dentures or bridgework may not be worn into surgery.  Leave your suitcase in the car.  After surgery it may be brought to your room.  For patients admitted to the hospital, discharge time will be determined by your treatment team.  Patients discharged the day of surgery will not be allowed to drive home and must have someone with them for 24 hours.    Special instructions:   DO NOT smoke tobacco or vape for 24 hours before your procedure.  Please read over the following fact sheets that you were given. Anesthesia Post-op Instructions and  Care and Recovery After Surgery      Colonoscopy, Adult, Care After The following information offers guidance on how to care for yourself after your procedure. Your health care provider may also give you more specific instructions. If you have problems or questions, contact your health care provider. What can I expect after the procedure? After the procedure, it is common to have: A small amount of blood in your stool for 24 hours after the procedure. Some gas. Mild cramping or bloating of your abdomen. Follow these instructions at home: Eating and drinking  Drink enough fluid to keep your urine pale yellow. Follow instructions from your health care provider about eating or drinking restrictions. Resume your normal diet as told by your health care provider. Avoid heavy or fried foods that are hard to digest. Activity Rest as told by your health care provider. Avoid sitting for a long time without moving. Get up to take short walks every 1-2 hours. This is important to improve blood flow and breathing. Ask for help if you feel weak or unsteady. Return to your normal activities as told by your health care provider.  Ask your health care provider what activities are safe for you. Managing cramping and bloating  Try walking around when you have cramps or feel bloated. If directed, apply heat to your abdomen as told by your health care provider. Use the heat source that your health care provider recommends, such as a moist heat pack or a heating pad. Place a towel between your skin and the heat source. Leave the heat on for 20-30 minutes. Remove the heat if your skin turns bright red. This is especially important if you are unable to feel pain, heat, or cold. You have a greater risk of getting burned. General instructions If you were given a sedative during the procedure, it can affect you for several hours. Do not drive or operate machinery until your health care provider says that it is  safe. For the first 24 hours after the procedure: Do not sign important documents. Do not drink alcohol. Do your regular daily activities at a slower pace than normal. Eat soft foods that are easy to digest. Take over-the-counter and prescription medicines only as told by your health care provider. Keep all follow-up visits. This is important. Contact a health care provider if: You have blood in your stool 2-3 days after the procedure. Get help right away if: You have more than a small spotting of blood in your stool. You have large blood clots in your stool. You have swelling of your abdomen. You have nausea or vomiting. You have a fever. You have increasing pain in your abdomen that is not relieved with medicine. These symptoms may be an emergency. Get help right away. Call 911. Do not wait to see if the symptoms will go away. Do not drive yourself to the hospital. Summary After the procedure, it is common to have a small amount of blood in your stool. You may also have mild cramping and bloating of your abdomen. If you were given a sedative during the procedure, it can affect you for several hours. Do not drive or operate machinery until your health care provider says that it is safe. Get help right away if you have a lot of blood in your stool, nausea or vomiting, a fever, or increased pain in your abdomen. This information is not intended to replace advice given to you by your health care provider. Make sure you discuss any questions you have with your health care provider. Document Revised: 11/27/2022 Document Reviewed: 06/07/2021 Elsevier Patient Education  2024 [employer] Monitored Anesthesia Care, Care After The following information offers guidance on how to care for yourself after your procedure. Your health care provider may also give you more specific instructions. If you have problems or questions, contact your health care provider. What can I expect after the  procedure? After the procedure, it is common to have: Tiredness. Little or no memory about what happened during or after the procedure. Impaired judgment when it comes to making decisions. Nausea or vomiting. Some trouble with balance. Follow these instructions at home: For the time period you were told by your health care provider:  Rest. Do not participate in activities where you could fall or become injured. Do not drive or use machinery. Do not drink alcohol. Do not take sleeping pills or medicines that cause drowsiness. Do not make important decisions or sign legal documents. Do not take care of children on your own. Medicines Take over-the-counter and prescription medicines only as told by your health care provider. If you were prescribed antibiotics, take  them as told by your health care provider. Do not stop using the antibiotic even if you start to feel better. Eating and drinking Follow instructions from your health care provider about what you may eat and drink. Drink enough fluid to keep your urine pale yellow. If you vomit: Drink clear fluids slowly and in small amounts as you are able. Clear fluids include water, ice chips, low-calorie sports drinks, and fruit juice that has water added to it (diluted fruit juice). Eat light and bland foods in small amounts as you are able. These foods include bananas, applesauce, rice, lean meats, toast, and crackers. General instructions  Have a responsible adult stay with you for the time you are told. It is important to have someone help care for you until you are awake and alert. If you have sleep apnea, surgery and some medicines can increase your risk for breathing problems. Follow instructions from your health care provider about wearing your sleep device: When you are sleeping. This includes during daytime naps. While taking prescription pain medicines, sleeping medicines, or medicines that make you drowsy. Do not use any  products that contain nicotine or tobacco. These products include cigarettes, chewing tobacco, and vaping devices, such as e-cigarettes. If you need help quitting, ask your health care provider. Contact a health care provider if: You feel nauseous or vomit every time you eat or drink. You feel light-headed. You are still sleepy or having trouble with balance after 24 hours. You get a rash. You have a fever. You have redness or swelling around the IV site. Get help right away if: You have trouble breathing. You have new confusion after you get home. These symptoms may be an emergency. Get help right away. Call 911. Do not wait to see if the symptoms will go away. Do not drive yourself to the hospital. This information is not intended to replace advice given to you by your health care provider. Make sure you discuss any questions you have with your health care provider. Document Revised: 03/12/2022 Document Reviewed: 03/12/2022 Elsevier Patient Education  2024 [employer].

## 2023-06-10 ENCOUNTER — Encounter: Payer: Self-pay | Admitting: *Deleted

## 2023-06-10 ENCOUNTER — Telehealth (INDEPENDENT_AMBULATORY_CARE_PROVIDER_SITE_OTHER): Payer: Self-pay | Admitting: *Deleted

## 2023-06-10 NOTE — Telephone Encounter (Signed)
Pt has been rescheduled for 9/112/24 at 1 pm with Dr.Rourk. Updated instructions mailed to pt.

## 2023-06-10 NOTE — Telephone Encounter (Signed)
Pt called back and  has been rescheduled for 07/08/23. Update instructions mailed.

## 2023-06-10 NOTE — Telephone Encounter (Signed)
-----   Message from Anabel Bene sent at 06/10/2023  8:34 AM EDT ----- This patient called & states he is sick. I advised him to call the office to reschedule.  [place]

## 2023-06-10 NOTE — Telephone Encounter (Signed)
Pt was on for thurs 8/15 with Dr. Jena Gauss. Has been taken off schedule

## 2023-06-11 ENCOUNTER — Encounter (HOSPITAL_COMMUNITY)
Admission: RE | Admit: 2023-06-11 | Discharge: 2023-06-11 | Disposition: A | Discharge status: Home / Self Care | Payer: Medicaid Other | Source: Ambulatory Visit | Attending: Internal Medicine | Admitting: Internal Medicine

## 2023-06-11 ENCOUNTER — Encounter (HOSPITAL_COMMUNITY): Payer: Self-pay

## 2023-06-11 DIAGNOSIS — Z6841 Body Mass Index (BMI) 40.0 and over, adult: Secondary | ICD-10-CM

## 2023-06-11 DIAGNOSIS — E119 Type 2 diabetes mellitus without complications: Secondary | ICD-10-CM

## 2023-06-11 DIAGNOSIS — Z720 Tobacco use: Secondary | ICD-10-CM

## 2023-06-17 ENCOUNTER — Ambulatory Visit (HOSPITAL_COMMUNITY): Payer: Medicaid Other

## 2023-06-24 ENCOUNTER — Telehealth: Payer: Self-pay

## 2023-06-24 NOTE — Telephone Encounter (Signed)
noted 

## 2023-06-24 NOTE — Telephone Encounter (Signed)
Aware of last colonoscopy in 05/2021, several small tubular adenomas. I discussed this previously with Dr. Jena Gauss who advised moving forward with updated colonoscopy. I would not anticipate insurance issues. If he prefers to wait until the 3 year mark, it is up to him. If he decides to wait, then please NIC for return ov in 03/2024.

## 2023-06-24 NOTE — Telephone Encounter (Signed)
Message sent to Endo to cancel procedure.

## 2023-06-24 NOTE — Telephone Encounter (Signed)
Pt is going to wait.   [name] R/[name]: Please cancel colonoscopy that is currently scheduled.  [name]/[name]: Please nic return ov to discuss colonoscopy in 03/2024.

## 2023-06-24 NOTE — Telephone Encounter (Signed)
Pt called stating that his last colonosopy stated that he was good for 3 to 5 years and pt is wanting to know if he should move forward with the colonoscopy that is currently scheduled or if he needs to wait. Pt states that he wants to make sure that it is needed before moving forward and his insurance possibly not covering it. Please advise.

## 2023-06-26 ENCOUNTER — Ambulatory Visit: Payer: Medicaid Other | Admitting: Nutrition

## 2023-06-26 LAB — HEMOGLOBIN A1C: Hemoglobin A1C: 10.8

## 2023-07-02 NOTE — Telephone Encounter (Signed)
Pt left vm wanting a return call  Called pt and he stated that he has covid and it's showing procedure hasn't been cancelled. I advised him that it was showing cancelled but his pre-op date is still showing on 07/04/23. Message sent to endo regarding cancelling his pre-op date.

## 2023-07-04 ENCOUNTER — Encounter (HOSPITAL_COMMUNITY): Payer: Medicaid Other

## 2023-07-08 ENCOUNTER — Other Ambulatory Visit (HOSPITAL_COMMUNITY): Payer: Medicaid Other

## 2023-07-08 ENCOUNTER — Encounter (HOSPITAL_COMMUNITY): Admission: RE | Payer: Self-pay | Source: Home / Self Care

## 2023-07-08 ENCOUNTER — Ambulatory Visit (HOSPITAL_COMMUNITY): Admission: RE | Admit: 2023-07-08 | Payer: Medicaid Other | Source: Home / Self Care | Admitting: Internal Medicine

## 2023-07-08 SURGERY — COLONOSCOPY WITH PROPOFOL
Anesthesia: Monitor Anesthesia Care

## 2023-07-10 NOTE — Patient Instructions (Signed)

## 2023-07-11 ENCOUNTER — Encounter: Payer: Self-pay | Admitting: *Deleted

## 2023-07-11 ENCOUNTER — Encounter: Payer: Self-pay | Admitting: Nurse Practitioner

## 2023-07-11 ENCOUNTER — Telehealth: Payer: Self-pay | Admitting: *Deleted

## 2023-07-11 ENCOUNTER — Ambulatory Visit (INDEPENDENT_AMBULATORY_CARE_PROVIDER_SITE_OTHER): Payer: Medicaid Other | Admitting: Nurse Practitioner

## 2023-07-11 VITALS — BP 103/68 | HR 80 | Ht 72.0 in | Wt 257.8 lb

## 2023-07-11 DIAGNOSIS — Z7985 Long-term (current) use of injectable non-insulin antidiabetic drugs: Secondary | ICD-10-CM | POA: Diagnosis not present

## 2023-07-11 DIAGNOSIS — E1165 Type 2 diabetes mellitus with hyperglycemia: Secondary | ICD-10-CM | POA: Diagnosis not present

## 2023-07-11 DIAGNOSIS — Z7984 Long term (current) use of oral hypoglycemic drugs: Secondary | ICD-10-CM

## 2023-07-11 DIAGNOSIS — Z794 Long term (current) use of insulin: Secondary | ICD-10-CM | POA: Diagnosis not present

## 2023-07-11 DIAGNOSIS — I10 Essential (primary) hypertension: Secondary | ICD-10-CM

## 2023-07-11 DIAGNOSIS — E782 Mixed hyperlipidemia: Secondary | ICD-10-CM

## 2023-07-11 MED ORDER — PEN NEEDLES 31G X 6 MM MISC: 3 refills | Status: AC

## 2023-07-11 MED ORDER — SEMAGLUTIDE (1 MG/DOSE) 4 MG/3ML ~~LOC~~ SOPN: 1.0000 mg | PEN_INJECTOR | SUBCUTANEOUS | 1 refills | Status: DC

## 2023-07-11 MED ORDER — LANTUS SOLOSTAR 100 UNIT/ML ~~LOC~~ SOPN: 75.0000 [IU] | PEN_INJECTOR | Freq: Every day | SUBCUTANEOUS | 3 refills | Status: DC

## 2023-07-11 NOTE — Telephone Encounter (Signed)
Received vm from [place] with pre-service center and she states that the authorization for the MRI ordered on pt has expired and needs a new pa.   LMOVM for Tonya, that pa was under clinical review and will call her when I have an update.  [employer] PA: CPT Code 40981 Description: MRI ABDOMEN W & W/O CONTRAST Case Number: [id number] Review Date: 07/11/2023 3:34:45 PM Expiration Date: N/A Status: Your case has been sent to clinical review. You will be notified via fax within 2 business days if additional clinical information is needed. If you wish to speak with a representative at anytime, please call [phone].

## 2023-07-11 NOTE — Progress Notes (Signed)
Endocrinology Consult Note       07/11/2023, 10:01 AM   Subjective:    Patient ID: Gary Burke, male    DOB: May 26, 1961.  Gary Burke is being seen in consultation for management of currently uncontrolled symptomatic diabetes requested by  Wilmon Pali, FNP.   Past Medical History:  Diagnosis Date   Bipolar disorder (HCC)    COPD (chronic obstructive pulmonary disease) (HCC)    Diabetes mellitus without complication (HCC) 04/2016   Hypertension    Pulmonary embolism (HCC)    L lung x2   Sleep apnea     Past Surgical History:  Procedure Laterality Date   APPENDECTOMY  age 62    Social History   Socioeconomic History   Marital status: Married    Spouse name: Not on file   Number of children: Not on file   Years of education: Not on file   Highest education level: Not on file  Occupational History   Not on file  Tobacco Use   Smoking status: Former    Current packs/day: 0.00    Average packs/day: 0.5 packs/day for 37.0 years (18.5 ttl pk-yrs)    Types: Cigarettes    Start date: 10/29/1977    Quit date: 10/29/2014    Years since quitting: 8.7   Smokeless tobacco: Never  Substance and Sexual Activity   Alcohol use: No   Drug use: No   Sexual activity: Yes    Birth control/protection: None  Other Topics Concern   Not on file  Social History Narrative   Not on file   Social Determinants of Health   Financial Resource Strain: Low Risk  (02/20/2023)   Received from [facility], [facility]   Overall Financial Resource Strain (CARDIA)    Difficulty of Paying Living Expenses: Not hard at all  Food Insecurity: No Food Insecurity (02/20/2023)   Received from [facility], [facility]   Hunger Vital Sign    Worried About Running Out of Food in the Last Year: Never true    Ran Out of Food in the Last Year: Never true  Transportation Needs: No Transportation Needs (02/20/2023)    Received from [facility], [facility]   PRAPARE - Transportation    Lack of Transportation (Medical): No    Lack of Transportation (Non-Medical): No  Physical Activity: Not on file  Stress: No Stress Concern Present (02/20/2023)   Received from [facility], [facility] of Occupational Health - Occupational Stress Questionnaire    Feeling of Stress : Not at all  Social Connections: Not on file    Family History  Problem Relation Age of Onset   Diabetes Daughter        Diabetes Type 1   Colon cancer Neg Hx     Outpatient Encounter Medications as of 07/11/2023  Medication Sig   albuterol (PROVENTIL) (2.5 MG/3ML) 0.083% nebulizer solution Take 3 mLs (2.5 mg total) by nebulization every 6 (six) hours as needed for wheezing or shortness of breath.   albuterol (VENTOLIN HFA) 108 (90 Base) MCG/ACT inhaler Inhale 1-2 puffs into the lungs every 6 (  six) hours as needed for wheezing or shortness of breath.   apixaban (ELIQUIS) 5 MG TABS tablet Take 1 tablet (5 mg total) by mouth 2 (two) times daily.   atorvastatin (LIPITOR) 40 MG tablet Take 1 tablet (40 mg total) by mouth daily.   fenofibrate (TRICOR) 145 MG tablet Take 145 mg by mouth daily.   fluticasone-salmeterol (ADVAIR) 500-50 MCG/ACT AEPB Inhale 1 puff into the lungs in the morning and at bedtime.   hydrocortisone 2.5 % cream Apply topically 3 (three) times daily.   insulin glargine (LANTUS SOLOSTAR) 100 UNIT/ML Solostar Pen Inject 75 Units into the skin daily.   Insulin Pen Needle (PEN NEEDLES) 31G X 6 MM MISC Use to inject insulin once daily   levocetirizine (XYZAL) 5 MG tablet Take 5 mg by mouth every evening.   losartan (COZAAR) 25 MG tablet Take 1 tablet (25 mg total) by mouth daily.   meclizine (ANTIVERT) 25 MG tablet Take 25 mg by mouth 3 (three) times daily as needed for dizziness or nausea.   metoprolol tartrate (LOPRESSOR) 25 MG tablet Take 1 tablet (25 mg total) by mouth 2 (two) times daily.    pantoprazole (PROTONIX) 40 MG tablet Take 40 mg by mouth daily.   Semaglutide, 1 MG/DOSE, 4 MG/3ML SOPN Inject 1 mg as directed once a week.   Sod Picosulfate-Mag Ox-Cit Acd (CLENPIQ) 10-3.5-12 MG-GM -GM/175ML SOLN Take 1 kit by mouth as directed.   tamsulosin (FLOMAX) 0.4 MG CAPS capsule Take 1 capsule (0.4 mg total) by mouth daily. (Patient taking differently: Take 0.4 mg by mouth daily. As needed)   [DISCONTINUED] insulin glargine (LANTUS) 100 UNIT/ML injection Inject 75 Units into the skin daily.   [DISCONTINUED] OZEMPIC, 0.25 OR 0.5 MG/DOSE, 2 MG/3ML SOPN Inject into the skin.   [DISCONTINUED] sitaGLIPtin-metformin (JANUMET) 50-1000 MG tablet Take 1 tablet by mouth 2 (two) times daily with a meal. (Patient not taking: Reported on 07/11/2023)   No facility-administered encounter medications on file as of 07/11/2023.    ALLERGIES: Allergies  Allergen Reactions   Penicillins Rash    Has patient had a PCN reaction causing immediate rash, facial/tongue/throat swelling, SOB or lightheadedness with hypotension: Yes Has patient had a PCN reaction causing severe rash involving mucus membranes or skin necrosis: No Has patient had a PCN reaction that required hospitalization No Has patient had a PCN reaction occurring within the last 10 years: No  If all of the above answers are "NO", then may proceed with Cephalosporin use.     VACCINATION STATUS: Immunization History  Administered Date(s) Administered   Influenza,inj,Quad PF,6+ Mos 10/16/2022   Moderna Sars-Covid-2 Vaccination 01/12/2020, 02/09/2020, 10/19/2020, 03/16/2021    Diabetes He presents for his initial diabetic visit. He has type 2 diabetes mellitus. Onset time: diagnosed at approx age of 46. His disease course has been fluctuating. There are no hypoglycemic associated symptoms. There are no diabetic associated symptoms. There are no hypoglycemic complications. There are no diabetic complications. Risk factors for coronary  artery disease include dyslipidemia, male sex, obesity, hypertension, family history and diabetes mellitus. Current diabetic treatment includes insulin injections (and Ozempic). He is compliant with treatment most of the time. His weight is fluctuating minimally. He is following a generally unhealthy diet. When asked about meal planning, he reported none. He has not had a previous visit with a dietitian. He participates in exercise intermittently. (He presents today for his consultation, accompanied by his wife, with no meter or logs to review.  He uses a CGM to  monitor glucose but forgot his reader today.  His most recent A1c on 8/28 was 10.8%, improving from previous A1c of 12%.  He notes he drinks water, unsweet tea, coffee with cream and sugar and occasionally SF orange juice.  He eats 3 meals per day with snacks between.  He enjoys walking for exercise.  He is due for eye exam, scheduled to see one in 2025.  He does not see podiatry.) An ACE inhibitor/angiotensin II receptor blocker is being taken. He does not see a podiatrist.Eye exam is current.     Review of systems  Constitutional: + Minimally fluctuating body weight, current Body mass index is 34.96 kg/m., no fatigue, no subjective hyperthermia, no subjective hypothermia Eyes: no blurry vision, no xerophthalmia ENT: no sore throat, no nodules palpated in throat, no dysphagia/odynophagia, no hoarseness Cardiovascular: no chest pain, no shortness of breath, no palpitations, no leg swelling Respiratory: no cough, no shortness of breath Gastrointestinal: no nausea/vomiting/diarrhea Musculoskeletal: no muscle/joint aches Skin: no rashes, no hyperemia Neurological: no tremors, no numbness, no tingling, no dizziness Psychiatric: no depression, no anxiety  Objective:     BP 103/68 (BP Location: Left Arm, Patient Position: Sitting, Cuff Size: Large)   Pulse 80   Ht 6' (1.829 m)   Wt 257 lb 12.8 oz (116.9 kg)   BMI 34.96 kg/m   Wt Readings  from Last 3 Encounters:  07/11/23 257 lb 12.8 oz (116.9 kg)  05/14/23 262 lb (118.8 kg)  05/06/23 260 lb (117.9 kg)     BP Readings from Last 3 Encounters:  07/11/23 103/68  05/14/23 125/84  05/03/23 127/83     Physical Exam- Limited  Constitutional:  Body mass index is 34.96 kg/m. , not in acute distress, normal state of mind Eyes:  EOMI, no exophthalmos Neck: Supple Cardiovascular: RRR, no murmurs, rubs, or gallops, no edema Respiratory: Adequate breathing efforts, no crackles, rales, rhonchi, or wheezing Musculoskeletal: no gross deformities, strength intact in all four extremities, no gross restriction of joint movements Skin:  no rashes, no hyperemia Neurological: no tremor with outstretched hands   Diabetic Foot Exam - Simple   No data filed      CMP ( most recent) CMP     Component Value Date/Time   NA 132 (L) 04/19/2023 1117   K 3.9 04/19/2023 1117   CL 100 04/19/2023 1117   CO2 23 04/19/2023 1117   GLUCOSE 395 (H) 04/19/2023 1117   BUN 20 04/19/2023 1117   BUN 19 03/26/2023 0000   CREATININE 0.88 04/19/2023 1117   CREATININE 1.07 09/26/2016 0901   CALCIUM 9.2 04/19/2023 1117   PROT 7.2 04/19/2023 1117   ALBUMIN 4.1 04/19/2023 1117   AST 17 04/19/2023 1117   ALT 18 04/19/2023 1117   ALKPHOS 54 04/19/2023 1117   BILITOT 1.2 04/19/2023 1117   EGFR 75 03/26/2023 0000   GFRNONAA >60 04/19/2023 1117   GFRNONAA 69 05/30/2016 1018     Diabetic Labs (most recent): Lab Results  Component Value Date   HGBA1C 10.8 06/26/2023   HGBA1C 12 03/26/2023   HGBA1C 14.2 (H) 11/13/2022   MICROALBUR 1.1 05/30/2016     Lipid Panel ( most recent) Lipid Panel     Component Value Date/Time   CHOL 108 11/13/2022 0852   TRIG 593 (A) 03/26/2023 0000   HDL 28 (L) 11/13/2022 0852   CHOLHDL 3.9 11/13/2022 0852   VLDL 36 11/13/2022 0852   LDLCALC 78 03/26/2023 0000      Lab Results  Component Value Date   TSH 1.37 03/26/2023           Assessment & Plan:    1) Type 2 diabetes mellitus with hyperglycemia, with long-term current use of insulin (HCC)  He presents today for his consultation, accompanied by his wife, with no meter or logs to review.  He uses a CGM to monitor glucose but forgot his reader today.  His most recent A1c on 8/28 was 10.8%, improving from previous A1c of 12%.  He notes he drinks water, unsweet tea, coffee with cream and sugar and occasionally SF orange juice.  He eats 3 meals per day with snacks between.  He enjoys walking for exercise.  He is due for eye exam, scheduled to see one in 2025.  He does not see podiatry.  - JIMMYLEE STRAHLE has currently uncontrolled symptomatic type 2 DM since 62 years of age, with most recent A1c of 10.8 %.   -Recent labs reviewed.  - I had a long discussion with him about the progressive nature of diabetes and the pathology behind its complications. 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 These are all discussed in detail with him.  The following Lifestyle Medicine recommendations according to American College of Lifestyle Medicine [facility]) were discussed and offered to patient and he agrees to start the journey:  A. Whole Foods, Plant-based plate comprising of fruits and vegetables, plant-based proteins, whole-grain carbohydrates was discussed in detail with the patient.   A list for source of those nutrients were also provided to the patient.  Patient will use only water or unsweetened tea for hydration. B.  The need to stay away from risky substances including alcohol, smoking; obtaining 7 to 9 hours of restorative sleep, at least 150 minutes of moderate intensity exercise weekly, the importance of healthy social connections,  and stress reduction techniques were discussed. C.  A full color page of  Calorie density of various food groups per pound showing examples of  each food groups was provided to the patient.  - I have counseled him on diet and weight management by adopting a carbohydrate restricted/protein rich diet. Patient is encouraged to switch to unprocessed or minimally processed complex starch and increased protein intake (animal or plant source), fruits, and vegetables. -  he is advised to stick to a routine mealtimes to eat 3 meals a day and avoid unnecessary snacks (to snack only to correct hypoglycemia).   - he acknowledges that there is a room for improvement in his food and drink choices. - Suggestion is made for him to avoid simple carbohydrates from his diet including Cakes, Sweet Desserts, Ice Cream, Soda (diet and regular), Sweet Tea, Candies, Chips, Cookies, Store Bought Juices, Alcohol in Excess of 1-2 drinks a day, Artificial Sweeteners, Coffee Creamer, and "Sugar-free" Products. This will help patient to have more stable blood glucose profile and potentially avoid unintended weight gain.  - I have approached him with the following individualized plan to manage his diabetes and patient agrees:   -He is advised to continue his Lantus at 75 units SQ daily but to start injecting at bedtime instead.  -he is encouraged to start monitoring glucose 2 times daily (using his CGM), before breakfast and before bed, to log their readings on the clinic sheets provided, and bring them to review at follow up appointment in 3 months.  -  he is warned not to take insulin without proper monitoring per orders. - Adjustment parameters are given to him for hypo and hyperglycemia in writing. - he is encouraged to call clinic for blood glucose levels less than 70 or above 300 mg /dl. - he is advised to continue Ozempic 1 mg SQ weekly (scheduled to increase to this dose at next injection), therapeutically suitable for patient .  - he did not tolerate Janumet in the past, caused severe diarrhea.  - Specific targets for  A1c; LDL, HDL, and Triglycerides were  discussed with the patient.  2) Blood Pressure /Hypertension:  his blood pressure is controlled to target.   he is advised to continue his current medications including Losartan 25 mg p.o. daily with breakfast and Metoprolol 25 mg po twice daily.  3) Lipids/Hyperlipidemia:    Review of his recent lipid panel from 03/26/23 showed controlled LDL at 78 but significantly elevated triglycerides of 593.  he is advised to continue Lipitor 40 mg daily at bedtime and Fenofibrate 145 mg po daily.  Side effects and precautions discussed with him.  4)  Weight/Diet:  his Body mass index is 34.96 kg/m.  -  clearly complicating his diabetes care.   he is a candidate for weight loss. I discussed with him the fact that loss of 5 - 10% of his  current body weight will have the most impact on his diabetes management.  Exercise, and detailed carbohydrates information provided  -  detailed on discharge instructions.  5) Chronic Care/Health Maintenance: -he is on ACEI/ARB and Statin medications and is encouraged to initiate and continue to follow up with Ophthalmology, Dentist, Podiatrist at least yearly or according to recommendations, and advised to stay away from smoking. I have recommended yearly flu vaccine and pneumonia vaccine at least every 5 years; moderate intensity exercise for up to 150 minutes weekly; and sleep for at least 7 hours a day.  - he is advised to maintain close follow up with Wilmon Pali, FNP for primary care needs, as well as his other providers for optimal and coordinated care.   - Time spent in this patient care: 60 min, of which > 50% was spent in counseling him about his diabetes and the rest reviewing his blood glucose logs, discussing his hypoglycemia and hyperglycemia episodes, reviewing his current and previous labs/studies (including abstraction from other facilities) and medications doses and developing a long term treatment plan based on the latest standards of care/guidelines;  and documenting his care.    Please refer to Patient Instructions for Blood Glucose Monitoring and Insulin/Medications Dosing Guide" in media tab for additional information. Please also refer to "Patient Self Inventory" in the Media tab for reviewed elements of pertinent patient history.  Greggory Keen participated in the discussions, expressed understanding, and voiced agreement with the above plans.  All questions were answered to his satisfaction. he is encouraged to contact clinic should he have any questions or concerns prior to his return visit.     Follow up plan: - Return in about 3 months (around 10/10/2023) for Diabetes F/U with A1c in office, No previsit labs, Bring meter and logs.    Ronny Bacon, [facility] [address] [place], [place] [address] Phone: [phone] Fax: [phone]  07/11/2023, 10:01 AM

## 2023-07-15 ENCOUNTER — Ambulatory Visit (HOSPITAL_COMMUNITY)
Admission: RE | Admit: 2023-07-15 | Discharge: 2023-07-15 | Disposition: A | Discharge status: Home / Self Care | Payer: Medicaid Other | Source: Ambulatory Visit | Attending: Gastroenterology | Admitting: Gastroenterology

## 2023-07-15 DIAGNOSIS — K769 Liver disease, unspecified: Secondary | ICD-10-CM | POA: Diagnosis present

## 2023-07-15 DIAGNOSIS — R932 Abnormal findings on diagnostic imaging of liver and biliary tract: Secondary | ICD-10-CM | POA: Diagnosis present

## 2023-07-15 MED ORDER — GADOBUTROL 1 MMOL/ML IV SOLN
10.0000 mL | Freq: Once | INTRAVENOUS | Status: AC | PRN
  Administered 2023-07-15: 10 mL via INTRAVENOUS

## 2023-07-15 NOTE — Telephone Encounter (Signed)
[facility] PA for MRI: Approval # [id number] DOS: 07/12/23-08/26/23

## 2023-08-08 ENCOUNTER — Ambulatory Visit: Payer: Medicaid Other | Admitting: Nutrition

## 2023-09-29 DIAGNOSIS — O2230 Deep phlebothrombosis in pregnancy, unspecified trimester: Secondary | ICD-10-CM

## 2023-09-29 HISTORY — DX: Deep phlebothrombosis in pregnancy, unspecified trimester: O22.30

## 2023-10-02 ENCOUNTER — Ambulatory Visit (INDEPENDENT_AMBULATORY_CARE_PROVIDER_SITE_OTHER): Payer: Medicaid Other | Admitting: Orthopedic Surgery

## 2023-10-02 ENCOUNTER — Encounter: Payer: Self-pay | Admitting: Orthopedic Surgery

## 2023-10-02 DIAGNOSIS — M170 Bilateral primary osteoarthritis of knee: Secondary | ICD-10-CM

## 2023-10-02 DIAGNOSIS — M1712 Unilateral primary osteoarthritis, left knee: Secondary | ICD-10-CM

## 2023-10-02 DIAGNOSIS — M1711 Unilateral primary osteoarthritis, right knee: Secondary | ICD-10-CM

## 2023-10-02 NOTE — Patient Instructions (Signed)

## 2023-10-02 NOTE — Progress Notes (Signed)
Return patient Visit  Assessment: Gary Burke is a 62 y.o. male with the following: 1.  Bilateral knee arthritis  Plan: Gary Burke has moderate degenerative changes in bilateral knees.  He continues to have good response with injections.  He would like to proceed with repeat injections today.  He is working hard to improve his A1c, and is diabetes.  He will be planning to visit his sister in late January, who unfortunately has advanced cancer.  He will contact the clinic as needed.   Procedure note injection Right knee joint   Verbal consent was obtained to inject the right knee joint  Timeout was completed to confirm the site of injection.  The skin was prepped with alcohol and ethyl chloride was sprayed at the injection site.  A 21-gauge needle was used to inject 40 mg of Depo-Medrol and 1% lidocaine (3 cc) into the right knee using an anterolateral approach.  There were no complications. A sterile bandage was applied.   Procedure note injection Left knee joint   Verbal consent was obtained to inject the left knee joint  Timeout was completed to confirm the site of injection.  The skin was prepped with alcohol and ethyl chloride was sprayed at the injection site.  A 21-gauge needle was used to inject 40 mg of Depo-Medrol and 1% lidocaine (3 cc) into the left knee using an anterolateral approach.  There were no complications. A sterile bandage was applied.     Follow-up: Return if symptoms worsen or fail to improve.  Subjective:  Chief Complaint  Patient presents with   Injections    Bilat knees wants injections   NDC: [address]-4    History of Present Illness: Gary Burke is a 61 y.o. male who returns to clinic for repeat evaluation of bilateral knees.  I have seen him in clinic several times for both knees.  Injections continue to be effective.  He notes a difference following the injections, and can tell when they are starting to wear off.  He is taking medications,  and weekly injections to improve his A1c.  In addition, his sister has advanced cancer, and he will be visiting her within the next couple of months.  Review of Systems: No fevers or chills No numbness or tingling No chest pain No shortness of breath No bowel or bladder dysfunction No GI distress No headaches   Objective: There were no vitals taken for this visit.  Physical Exam:  General: Alert and oriented. and No acute distress. Gait: Right sided antalgic gait.  Bilateral knees without effusion.  Tenderness to palpation in the medial knee.  Mild varus alignment clinically.  This does react, with a little bit of laxity.  Negative Lachman bilaterally.  IMAGING: No new imaging obtained today   New Medications:  No orders of the defined types were placed in this encounter.     Oliver Barre, MD  10/02/2023 1:15 PM

## 2023-10-10 ENCOUNTER — Ambulatory Visit: Payer: Medicaid Other | Admitting: Nutrition

## 2023-10-14 DIAGNOSIS — I82401 Acute embolism and thrombosis of unspecified deep veins of right lower extremity: Secondary | ICD-10-CM | POA: Insufficient documentation

## 2023-10-18 ENCOUNTER — Ambulatory Visit: Payer: Medicaid Other | Admitting: Nurse Practitioner

## 2023-11-08 DIAGNOSIS — D72829 Elevated white blood cell count, unspecified: Secondary | ICD-10-CM | POA: Insufficient documentation

## 2023-11-10 NOTE — Progress Notes (Signed)
 [facility] [address], [place] [address]   Clinic Day:  11/11/2023  Referring physician: Gerome Tillman CROME, FNP  Patient Care Team: Gerome Tillman CROME, FNP as PCP - General (Family Medicine)   ASSESSMENT & PLAN:   Assessment:  1.  Neutrophilic leukocytosis: - Patient seen at the request of Tillman Gerome, FNP - CBC (10/01/2023): WBC-13, Hb-16.2, PLT-266.  65% and, 7% M, 23% L - He uses fluticasone  inhaler daily.  No recent oral prednisone.  No recurrent infections. - No B symptoms.  2.  Social/family history: - She lives at home with his wife.  She is retired.  No chemical exposure in his prior line of work.  Quit smoking more than 10 years ago.  Smoked 1 pack/day starting at age 8, until his early 84s. - Sister died of metastatic breast cancer at age 77.  3.  Unprovoked right leg DVT: - Doppler 10/14/2023: Occlusive DVT in the right popliteal, posterior tibial and peroneal veins. - CT angiogram (10/14/2023): No large central embolus.  Patchy bilateral groundglass opacities.  Prominent right hilar lymph node similar to previous. - He is on Eliquis .  Plan:  1.  Neutrophilic leukocytosis: - We have discussed reactive and clonal causes of neutrophilic leukocytosis. - Will repeat CBC with differential today.  If the white count continues to be high, will check for myeloproliferative neoplasms (JAK2 V617F with reflex testing and BCR/ABL by FISH) and inflammatory markers. - RTC 6 weeks for follow-up. - If today's CBC shows normal white count, will repeat CBC at next visit.  If he develops leukocytosis again, will check for myeloproliferative neoplasms.   Orders Placed This Encounter  Procedures   CBC with Differential    Standing Status:   Future    Number of Occurrences:   1    Expected Date:   11/11/2023    Expiration Date:   11/10/2024   Lactate dehydrogenase    Standing Status:   Future    Number of Occurrences:   1    Expected Date:   11/11/2023     Expiration Date:   11/10/2024   Sedimentation rate    Standing Status:   Future    Number of Occurrences:   1    Expected Date:   11/11/2023    Expiration Date:   11/10/2024   C-reactive protein    Standing Status:   Future    Number of Occurrences:   1    Expected Date:   11/11/2023    Expiration Date:   11/10/2024   BCR-ABL1 FISH    Standing Status:   Future    Expected Date:   11/11/2023    Expiration Date:   11/10/2024   JAK2 V617F rfx CALR/MPL/E12-15    Standing Status:   Future    Expected Date:   11/11/2023    Expiration Date:   11/10/2024      I,Katie Daubenspeck,acting as a scribe for Alean Stands, MD.,have documented all relevant documentation on the behalf of Alean Stands, MD,as directed by  Alean Stands, MD while in the presence of Alean Stands, MD.   I, Alean Stands MD, have reviewed the above documentation for accuracy and completeness, and I agree with the above.   Alean Stands, MD   1/13/20254:24 PM  CHIEF COMPLAINT/PURPOSE OF CONSULT:   Diagnosis: elevated WBC count  Current Therapy: Under workup  HISTORY OF PRESENT ILLNESS:   Gary Burke is a 63 y.o. male presenting to clinic today for  evaluation of elevated WBC count at the request of Tillman Collet, FNP.  He has a long-standing history of his WBC count being on the high end of normal. It was previously elevated in 2014 (at 12.5, per records with [facility]) and in 2017 (up to 12.2, per records from [facility]). Most recently, he was referred to the ED on 10/14/23 due to right calf pain. He was found to have occlusive DVT in right popliteal, posterior tibial, and peroneal veins and was subsequently admitted. His CBC also showed an elevated WBC count of 12.6. A repeat the following day showed a rise to 13.1.  Today, he states that he is doing well overall. His appetite level is at 100%. His energy level is at 100%.  PAST MEDICAL HISTORY:   Past Medical History: Past  Medical History:  Diagnosis Date   Bipolar disorder (HCC)    COPD (chronic obstructive pulmonary disease) (HCC)    Diabetes mellitus without complication (HCC) 04/2016   Hypertension    Pulmonary embolism (HCC)    L lung x2   Sleep apnea     Surgical History: Past Surgical History:  Procedure Laterality Date   APPENDECTOMY  age 65    Social History: Social History   Socioeconomic History   Marital status: Married    Spouse name: Not on file   Number of children: Not on file   Years of education: Not on file   Highest education level: Not on file  Occupational History   Not on file  Tobacco Use   Smoking status: Former    Current packs/day: 0.00    Average packs/day: 0.5 packs/day for 37.0 years (18.5 ttl pk-yrs)    Types: Cigarettes    Start date: 10/29/1977    Quit date: 10/29/2014    Years since quitting: 9.0   Smokeless tobacco: Never  Substance and Sexual Activity   Alcohol use: No   Drug use: No   Sexual activity: Yes    Birth control/protection: None  Other Topics Concern   Not on file  Social History Narrative   Not on file   Social Drivers of Health   Financial Resource Strain: Low Risk  (02/20/2023)   [facility], [facility]   Overall Financial Resource Strain (CARDIA)    Difficulty of Paying Living Expenses: Not hard at all  Food Insecurity: No Food Insecurity (11/11/2023)   Hunger Vital Sign    Worried About Running Out of Food in the Last Year: Never true    Ran Out of Food in the Last Year: Never true  Transportation Needs: No Transportation Needs (11/11/2023)   PRAPARE - Administrator, Civil Service (Medical): No    Lack of Transportation (Non-Medical): No  Physical Activity: Not on file  Stress: No Stress Concern Present (02/20/2023)   Received from [facility], [facility]   [facility] - Occupational Stress Questionnaire    Feeling of Stress : Not at all  Social  Connections: Not on file  Intimate Partner Violence: Not At Risk (11/11/2023)   Humiliation, Afraid, Rape, and Kick questionnaire    Fear of Current or Ex-Partner: No    Emotionally Abused: No    Physically Abused: No    Sexually Abused: No    Family History: Family History  Problem Relation Age of Onset   Diabetes Daughter        Diabetes Type 1   Colon cancer  Neg Hx     Current Medications:  Current Outpatient Medications:    albuterol  (PROVENTIL ) (2.5 MG/3ML) 0.083% nebulizer solution, Take 3 mLs (2.5 mg total) by nebulization every 6 (six) hours as needed for wheezing or shortness of breath., Disp: 75 mL, Rfl: 0   albuterol  (VENTOLIN  HFA) 108 (90 Base) MCG/ACT inhaler, Inhale 1-2 puffs into the lungs every 6 (six) hours as needed for wheezing or shortness of breath., Disp: 3 each, Rfl: 0   apixaban  (ELIQUIS ) 5 MG TABS tablet, Take 1 tablet (5 mg total) by mouth 2 (two) times daily., Disp: 180 tablet, Rfl: 3   atorvastatin  (LIPITOR) 40 MG tablet, Take 1 tablet (40 mg total) by mouth daily., Disp: 90 tablet, Rfl: 0   fenofibrate (TRICOR) 145 MG tablet, Take 145 mg by mouth daily., Disp: , Rfl:    fluticasone -salmeterol (ADVAIR) 500-50 MCG/ACT AEPB, Inhale 1 puff into the lungs in the morning and at bedtime., Disp: 3 each, Rfl: 0   hydrocortisone 2.5 % cream, Apply topically 3 (three) times daily., Disp: , Rfl:    insulin  glargine (LANTUS  SOLOSTAR) 100 UNIT/ML Solostar Pen, Inject 75 Units into the skin daily., Disp: 60 mL, Rfl: 3   Insulin  Pen Needle (PEN NEEDLES) 31G X 6 MM MISC, Use to inject insulin  once daily, Disp: 100 each, Rfl: 3   levocetirizine (XYZAL) 5 MG tablet, Take 5 mg by mouth every evening., Disp: , Rfl:    losartan  (COZAAR ) 25 MG tablet, Take 1 tablet (25 mg total) by mouth daily., Disp: 90 tablet, Rfl: 0   meclizine (ANTIVERT) 25 MG tablet, Take 25 mg by mouth 3 (three) times daily as needed for dizziness or nausea., Disp: , Rfl:    metoprolol  tartrate (LOPRESSOR )  25 MG tablet, Take 1 tablet (25 mg total) by mouth 2 (two) times daily., Disp: 180 tablet, Rfl: 0   pantoprazole (PROTONIX) 40 MG tablet, Take 40 mg by mouth daily., Disp: , Rfl:    Semaglutide , 1 MG/DOSE, 4 MG/3ML SOPN, Inject 1 mg as directed once a week., Disp: 9 mL, Rfl: 1   Sod Picosulfate-Mag Ox-Cit Acd (CLENPIQ ) 10-3.5-12 MG-GM -GM/175ML SOLN, Take 1 kit by mouth as directed., Disp: 350 mL, Rfl: 0   tamsulosin  (FLOMAX ) 0.4 MG CAPS capsule, Take 1 capsule (0.4 mg total) by mouth daily. (Patient taking differently: Take 0.4 mg by mouth daily. As needed), Disp: 30 capsule, Rfl: 4   Allergies: Allergies  Allergen Reactions   Penicillins Rash    Has patient had a PCN reaction causing immediate rash, facial/tongue/throat swelling, SOB or lightheadedness with hypotension: Yes Has patient had a PCN reaction causing severe rash involving mucus membranes or skin necrosis: No Has patient had a PCN reaction that required hospitalization No Has patient had a PCN reaction occurring within the last 10 years: No  If all of the above answers are NO, then may proceed with Cephalosporin use.     REVIEW OF SYSTEMS:   Review of Systems  Constitutional:  Negative for chills, fatigue and fever.  HENT:   Negative for lump/mass, mouth sores, nosebleeds, sore throat and trouble swallowing.   Eyes:  Negative for eye problems.  Respiratory:  Negative for cough and shortness of breath.   Cardiovascular:  Negative for chest pain, leg swelling and palpitations.  Gastrointestinal:  Negative for abdominal pain, constipation, diarrhea, nausea and vomiting.  Genitourinary:  Negative for bladder incontinence, difficulty urinating, dysuria, frequency, hematuria and nocturia.   Musculoskeletal:  Negative for arthralgias, back pain, flank  pain, myalgias and neck pain.  Skin:  Negative for itching and rash.  Neurological:  Negative for dizziness, headaches and numbness.  Hematological:  Does not bruise/bleed easily.   Psychiatric/Behavioral:  Negative for depression, sleep disturbance and suicidal ideas. The patient is not nervous/anxious.   All other systems reviewed and are negative.    VITALS:   Blood pressure (!) 130/96, pulse 73, temperature (!) 97.1 F (36.2 C), temperature source Tympanic, resp. rate 20, height 6' 0.44 (1.84 m), weight 251 lb 15.8 oz (114.3 kg), SpO2 99%.  Wt Readings from Last 3 Encounters:  11/11/23 251 lb 15.8 oz (114.3 kg)  07/11/23 257 lb 12.8 oz (116.9 kg)  05/14/23 262 lb (118.8 kg)    Body mass index is 33.76 kg/m.   PHYSICAL EXAM:   Physical Exam Vitals and nursing note reviewed. Exam conducted with a chaperone present.  Constitutional:      Appearance: Normal appearance.  Cardiovascular:     Rate and Rhythm: Normal rate and regular rhythm.     Pulses: Normal pulses.     Heart sounds: Normal heart sounds.  Pulmonary:     Effort: Pulmonary effort is normal.     Breath sounds: Normal breath sounds.  Abdominal:     Palpations: Abdomen is soft. There is no hepatomegaly, splenomegaly or mass.     Tenderness: There is no abdominal tenderness.  Musculoskeletal:     Right lower leg: No edema.     Left lower leg: No edema.  Lymphadenopathy:     Cervical: No cervical adenopathy.     Right cervical: No superficial, deep or posterior cervical adenopathy.    Left cervical: No superficial, deep or posterior cervical adenopathy.     Upper Body:     Right upper body: No supraclavicular or axillary adenopathy.     Left upper body: No supraclavicular or axillary adenopathy.  Neurological:     General: No focal deficit present.     Mental Status: He is alert and oriented to person, place, and time.  Psychiatric:        Mood and Affect: Mood normal.        Behavior: Behavior normal.     LABS:   CBC    Component Value Date/Time   WBC 9.6 11/11/2023 1052   RBC 5.26 11/11/2023 1052   HGB 15.0 11/11/2023 1052   HCT 44.8 11/11/2023 1052   PLT 254 11/11/2023  1052   MCV 85.2 11/11/2023 1052   MCH 28.5 11/11/2023 1052   MCHC 33.5 11/11/2023 1052   RDW 13.7 11/11/2023 1052   LYMPHSABS 2.8 11/11/2023 1052   MONOABS 0.7 11/11/2023 1052   EOSABS 0.3 11/11/2023 1052   BASOSABS 0.1 11/11/2023 1052    CMP    Component Value Date/Time   NA 132 (L) 04/19/2023 1117   K 3.9 04/19/2023 1117   CL 100 04/19/2023 1117   CO2 23 04/19/2023 1117   GLUCOSE 395 (H) 04/19/2023 1117   BUN 20 04/19/2023 1117   BUN 19 03/26/2023 0000   CREATININE 0.88 04/19/2023 1117   CREATININE 1.07 09/26/2016 0901   CALCIUM  9.2 04/19/2023 1117   PROT 7.2 04/19/2023 1117   ALBUMIN 4.1 04/19/2023 1117   AST 17 04/19/2023 1117   ALT 18 04/19/2023 1117   ALKPHOS 54 04/19/2023 1117   BILITOT 1.2 04/19/2023 1117   GFRNONAA >60 04/19/2023 1117   GFRNONAA 69 05/30/2016 1018   GFRAA >60 10/12/2016 2211   GFRAA 80 05/30/2016 1018  No results found for: CEA1, CEA / No results found for: CEA1, CEA No results found for: PSA1 No results found for: CAN199 No results found for: CAN125  No results found for: TOTALPROTELP, ALBUMINELP, A1GS, A2GS, BETS, BETA2SER, GAMS, MSPIKE, SPEI No results found for: TIBC, FERRITIN, IRONPCTSAT Lab Results  Component Value Date   LDH 112 11/11/2023     STUDIES:   No results found.

## 2023-11-11 ENCOUNTER — Inpatient Hospital Stay: Payer: Medicaid Other

## 2023-11-11 ENCOUNTER — Encounter: Payer: Self-pay | Admitting: Hematology

## 2023-11-11 ENCOUNTER — Inpatient Hospital Stay: Payer: Medicaid Other | Attending: Hematology | Admitting: Hematology

## 2023-11-11 VITALS — BP 130/96 | HR 73 | Temp 97.1°F | Resp 20 | Ht 72.44 in | Wt 252.0 lb

## 2023-11-11 DIAGNOSIS — I10 Essential (primary) hypertension: Secondary | ICD-10-CM | POA: Insufficient documentation

## 2023-11-11 DIAGNOSIS — Z86711 Personal history of pulmonary embolism: Secondary | ICD-10-CM | POA: Diagnosis not present

## 2023-11-11 DIAGNOSIS — D72829 Elevated white blood cell count, unspecified: Secondary | ICD-10-CM | POA: Insufficient documentation

## 2023-11-11 DIAGNOSIS — Z86718 Personal history of other venous thrombosis and embolism: Secondary | ICD-10-CM | POA: Insufficient documentation

## 2023-11-11 DIAGNOSIS — Z794 Long term (current) use of insulin: Secondary | ICD-10-CM | POA: Diagnosis not present

## 2023-11-11 DIAGNOSIS — Z7901 Long term (current) use of anticoagulants: Secondary | ICD-10-CM | POA: Diagnosis not present

## 2023-11-11 DIAGNOSIS — Z87891 Personal history of nicotine dependence: Secondary | ICD-10-CM | POA: Diagnosis not present

## 2023-11-11 DIAGNOSIS — E119 Type 2 diabetes mellitus without complications: Secondary | ICD-10-CM | POA: Insufficient documentation

## 2023-11-11 DIAGNOSIS — D72825 Bandemia: Secondary | ICD-10-CM

## 2023-11-11 LAB — CBC WITH DIFFERENTIAL/PLATELET
Abs Immature Granulocytes: 0.03 10*3/uL (ref 0.00–0.07)
Basophils Absolute: 0.1 10*3/uL (ref 0.0–0.1)
Basophils Relative: 1 %
Eosinophils Absolute: 0.3 10*3/uL (ref 0.0–0.5)
Eosinophils Relative: 3 %
HCT: 44.8 % (ref 39.0–52.0)
Hemoglobin: 15 g/dL (ref 13.0–17.0)
Immature Granulocytes: 0 %
Lymphocytes Relative: 29 %
Lymphs Abs: 2.8 10*3/uL (ref 0.7–4.0)
MCH: 28.5 pg (ref 26.0–34.0)
MCHC: 33.5 g/dL (ref 30.0–36.0)
MCV: 85.2 fL (ref 80.0–100.0)
Monocytes Absolute: 0.7 10*3/uL (ref 0.1–1.0)
Monocytes Relative: 8 %
Neutro Abs: 5.7 10*3/uL (ref 1.7–7.7)
Neutrophils Relative %: 59 %
Platelets: 254 10*3/uL (ref 150–400)
RBC: 5.26 MIL/uL (ref 4.22–5.81)
RDW: 13.7 % (ref 11.5–15.5)
WBC: 9.6 10*3/uL (ref 4.0–10.5)
nRBC: 0 % (ref 0.0–0.2)

## 2023-11-11 LAB — LACTATE DEHYDROGENASE: LDH: 112 U/L (ref 98–192)

## 2023-11-11 LAB — C-REACTIVE PROTEIN: CRP: <0.5 mg/dL (ref <1.0)

## 2023-11-11 LAB — SEDIMENTATION RATE: Sed Rate: 3 mm/h (ref 0–16)

## 2023-11-11 NOTE — Patient Instructions (Signed)
You were seen and examined today by Dr. Ellin Saba. Dr. Ellin Saba is a hematologist, meaning that he specializes in blood abnormalities. Dr. Ellin Saba discussed your past medical history, family history of cancers/blood conditions and the events that led to you being here today.  You were referred to Dr. Ellin Saba due to leukocytosis (elevated white blood cell count).  Dr. Ellin Saba has recommended additional labs today for further evaluation.  Follow-up as scheduled.

## 2023-11-18 ENCOUNTER — Inpatient Hospital Stay: Payer: Medicaid Other | Admitting: Oncology

## 2023-11-18 ENCOUNTER — Inpatient Hospital Stay: Payer: Medicaid Other

## 2023-12-20 ENCOUNTER — Encounter: Payer: Self-pay | Admitting: Physician Assistant

## 2023-12-20 NOTE — Progress Notes (Deleted)
 [facility] [address], [place] [address]   CLINIC:  Medical Oncology/Hematology  PCP:  Wilmon Pali, FNP [address] [place] [address] [phone]   REASON FOR VISIT:  Follow-up for leukocytosis  PRIOR THERAPY: None  CURRENT THERAPY: Under workup  INTERVAL HISTORY:   Gary Burke 63 y.o. male returns for routine follow-up of leukocytosis.  He was seen for initial consultation by Dr. Ellin Saba on 11/11/2023.  At today's visit, he reports feeling ***.  No recent hospitalizations, surgeries, or changes in baseline health status.  *** Steroids (systemic oral/injection, inhaled): FLUTICASONE INHALER DAILY.  *** Infections or antibiotics (including skin infections):  NO RECURRENT INFECTIONS. *** Connective tissue disorder or chronic inflammatory disease *** Smoking or vaping: QUIT SMOKING 10+ YEARS AGO *** Fever, chills, night sweats, unintentional weight loss: NO B SYMPTOMS. *** Masses or lymphadenopathy *** Joint stiffness or rashes   He has ***% energy and ***% appetite. He endorses that he is maintaining a stable weight.   ASSESSMENT & PLAN:  1.  Neutrophilic leukocytosis: - Patient seen at the request of Coral Ceo, FNP.  Labs sent by PCP from 10/01/2023 showed WBC 13.0 (neutrophil predominant), with normal hemoglobin and platelets - Review of prior labs shows predominantly normal WBC, with intermittent elevations. - He uses fluticasone inhaler daily.  No recent oral prednisone.  No recurrent infections. - No B symptoms.*** - Most recent CBC/D (11/11/2023) shows normal WBC/differential.  LDH, ESR, CRP normal. - We have discussed reactive and clonal causes of neutrophilic leukocytosis.  Intermittent leukocytosis in the setting of chronic illness (diabetes) and inhaled steroid (fluticasone) is most likely reactive. - PLAN: Repeat CBC/D today - if WBC elevated, we will check for MPN's (JAK2 V617F with reflex + BCR/ABL FISH). - RTC 6 months with  repeat CBC/D  2.  Unprovoked right leg DVT: - Doppler 10/14/2023: Occlusive DVT in the right popliteal, posterior tibial and peroneal veins. - CT angiogram (10/14/2023): No large central embolus.  Patchy bilateral groundglass opacities.  Prominent right hilar lymph node similar to previous. - He previously had a pulmonary embolism many years ago - He is on Eliquis.  3.  Other history - PMH: COPD, diabetes, hypertension, sleep apnea, bipolar disorder - He lives at home with his wife.  He is retired.  No chemical exposure in his prior line of work. - Quit smoking more than 10 years ago.  Smoked 1 pack/day starting at age 45, until his early 15s. - Sister died of metastatic breast cancer at age 65.  PLAN SUMMARY: >> *** >> *** >> ***   [facility] **VISIT SUMMARY & IMPORTANT INSTRUCTIONS **   You were seen today by Rojelio Brenner PA-C for your ***.    *** ***  *** ***  LABS: Return in ***   OTHER TESTS: ***  MEDICATIONS: ***  FOLLOW-UP APPOINTMENT: ***     REVIEW OF SYSTEMS: ***  Review of Systems - Oncology   PHYSICAL EXAM:  ECOG PERFORMANCE STATUS: {CHL ONC ECOG VH:[id number]} *** There were no vitals filed for this visit. There were no vitals filed for this visit. Physical Exam  PAST MEDICAL/SURGICAL HISTORY:  Past Medical History:  Diagnosis Date   Bipolar disorder (HCC)    COPD (chronic obstructive pulmonary disease) (HCC)    Diabetes mellitus without complication (HCC) 04/2016   Hypertension    Pulmonary embolism (HCC)    L lung x2   Sleep apnea  Past Surgical History:  Procedure Laterality Date   APPENDECTOMY  age 69    SOCIAL HISTORY:  Social History   Socioeconomic History   Marital status: Married    Spouse name: Not on file   Number of children: Not on file   Years of education: Not on file   Highest education level: Not on file  Occupational History   Not on file  Tobacco Use   Smoking  status: Former    Current packs/day: 0.00    Average packs/day: 0.5 packs/day for 37.0 years (18.5 ttl pk-yrs)    Types: Cigarettes    Start date: 10/29/1977    Quit date: 10/29/2014    Years since quitting: 9.1   Smokeless tobacco: Never  Substance and Sexual Activity   Alcohol use: No   Drug use: No   Sexual activity: Yes    Birth control/protection: None  Other Topics Concern   Not on file  Social History Narrative   Not on file   Social Drivers of Health   Financial Resource Strain: Low Risk  (02/20/2023)   Received from [facility], [facility]   Overall Financial Resource Strain (CARDIA)    Difficulty of Paying Living Expenses: Not hard at all  Food Insecurity: No Food Insecurity (11/11/2023)   Hunger Vital Sign    Worried About Running Out of Food in the Last Year: Never true    Ran Out of Food in the Last Year: Never true  Transportation Needs: No Transportation Needs (11/11/2023)   PRAPARE - Administrator, Civil Service (Medical): No    Lack of Transportation (Non-Medical): No  Physical Activity: Not on file  Stress: No Stress Concern Present (02/20/2023)   Received from [facility], [facility] - Occupational Stress Questionnaire    Feeling of Stress : Not at all  Social Connections: Not on file  Intimate Partner Violence: Not At Risk (11/11/2023)   Humiliation, Afraid, Rape, and Kick questionnaire    Fear of Current or Ex-Partner: No    Emotionally Abused: No    Physically Abused: No    Sexually Abused: No    FAMILY HISTORY:  Family History  Problem Relation Age of Onset   Diabetes Daughter        Diabetes Type 1   Colon cancer Neg Hx     CURRENT MEDICATIONS:  Outpatient Encounter Medications as of 12/23/2023  Medication Sig Note   albuterol (PROVENTIL) (2.5 MG/3ML) 0.083% nebulizer solution Take 3 mLs (2.5 mg total) by nebulization every 6 (six) hours as needed for wheezing or shortness  of breath.    albuterol (VENTOLIN HFA) 108 (90 Base) MCG/ACT inhaler Inhale 1-2 puffs into the lungs every 6 (six) hours as needed for wheezing or shortness of breath.    apixaban (ELIQUIS) 5 MG TABS tablet Take 1 tablet (5 mg total) by mouth 2 (two) times daily.    atorvastatin (LIPITOR) 40 MG tablet Take 1 tablet (40 mg total) by mouth daily.    fenofibrate (TRICOR) 145 MG tablet Take 145 mg by mouth daily.    fluticasone-salmeterol (ADVAIR) 500-50 MCG/ACT AEPB Inhale 1 puff into the lungs in the morning and at bedtime.    hydrocortisone 2.5 % cream Apply topically 3 (three) times daily.    insulin glargine (LANTUS SOLOSTAR) 100 UNIT/ML Solostar Pen Inject 75 Units into the skin daily.    Insulin Pen Needle (PEN NEEDLES) 31G X 6  MM MISC Use to inject insulin once daily    levocetirizine (XYZAL) 5 MG tablet Take 5 mg by mouth every evening. 07/11/2023: As needed   losartan (COZAAR) 25 MG tablet Take 1 tablet (25 mg total) by mouth daily.    meclizine (ANTIVERT) 25 MG tablet Take 25 mg by mouth 3 (three) times daily as needed for dizziness or nausea.    metoprolol tartrate (LOPRESSOR) 25 MG tablet Take 1 tablet (25 mg total) by mouth 2 (two) times daily.    pantoprazole (PROTONIX) 40 MG tablet Take 40 mg by mouth daily.    Semaglutide, 1 MG/DOSE, 4 MG/3ML SOPN Inject 1 mg as directed once a week.    Sod Picosulfate-Mag Ox-Cit Acd (CLENPIQ) 10-3.5-12 MG-GM -GM/175ML SOLN Take 1 kit by mouth as directed.    tamsulosin (FLOMAX) 0.4 MG CAPS capsule Take 1 capsule (0.4 mg total) by mouth daily. (Patient taking differently: Take 0.4 mg by mouth daily. As needed)    No facility-administered encounter medications on file as of 12/23/2023.    ALLERGIES:  Allergies  Allergen Reactions   Penicillins Rash    Has patient had a PCN reaction causing immediate rash, facial/tongue/throat swelling, SOB or lightheadedness with hypotension: Yes Has patient had a PCN reaction causing severe rash involving mucus  membranes or skin necrosis: No Has patient had a PCN reaction that required hospitalization No Has patient had a PCN reaction occurring within the last 10 years: No  If all of the above answers are "NO", then may proceed with Cephalosporin use.     LABORATORY DATA:  I have reviewed the labs as listed.  CBC    Component Value Date/Time   WBC 9.6 11/11/2023 1052   RBC 5.26 11/11/2023 1052   HGB 15.0 11/11/2023 1052   HCT 44.8 11/11/2023 1052   PLT 254 11/11/2023 1052   MCV 85.2 11/11/2023 1052   MCH 28.5 11/11/2023 1052   MCHC 33.5 11/11/2023 1052   RDW 13.7 11/11/2023 1052   LYMPHSABS 2.8 11/11/2023 1052   MONOABS 0.7 11/11/2023 1052   EOSABS 0.3 11/11/2023 1052   BASOSABS 0.1 11/11/2023 1052      Latest Ref Rng & Units 04/19/2023   11:17 AM 03/26/2023   12:00 AM 11/13/2022    8:52 AM  CMP  Glucose 70 - 99 mg/dL 409   811   BUN 8 - 23 mg/dL 20  19     19    Creatinine 0.61 - 1.24 mg/dL 9.14  1.1     7.82   Sodium 135 - 145 mmol/L 132   129   Potassium 3.5 - 5.1 mmol/L 3.9   4.7   Chloride 98 - 111 mmol/L 100   95   CO2 22 - 32 mmol/L 23   24   Calcium 8.9 - 10.3 mg/dL 9.2   9.3   Total Protein 6.5 - 8.1 g/dL 7.2   7.6   Total Bilirubin 0.3 - 1.2 mg/dL 1.2   2.1   Alkaline Phos 38 - 126 U/L 54   70   AST 15 - 41 U/L 17   20   ALT 0 - 44 U/L 18   19      This result is from an external source.    DIAGNOSTIC IMAGING:  I have independently reviewed the relevant imaging and discussed with the patient.   WRAP UP:  All questions were answered. The patient knows to call the clinic with any problems, questions or concerns.  Medical  decision making: ***  Time spent on visit: I spent *** minutes counseling the patient face to face. The total time spent in the appointment was *** minutes and more than 50% was on counseling.  Carnella Guadalajara, PA-C  ***

## 2023-12-23 ENCOUNTER — Inpatient Hospital Stay: Payer: Medicaid Other | Admitting: Physician Assistant

## 2023-12-24 ENCOUNTER — Other Ambulatory Visit (INDEPENDENT_AMBULATORY_CARE_PROVIDER_SITE_OTHER): Payer: Self-pay | Admitting: Nurse Practitioner

## 2023-12-24 DIAGNOSIS — I83893 Varicose veins of bilateral lower extremities with other complications: Secondary | ICD-10-CM

## 2023-12-25 ENCOUNTER — Encounter (INDEPENDENT_AMBULATORY_CARE_PROVIDER_SITE_OTHER): Payer: Medicaid Other

## 2023-12-25 ENCOUNTER — Encounter (INDEPENDENT_AMBULATORY_CARE_PROVIDER_SITE_OTHER): Payer: Medicaid Other | Admitting: Nurse Practitioner

## 2024-01-02 ENCOUNTER — Ambulatory Visit: Payer: Medicaid Other | Admitting: Nurse Practitioner

## 2024-01-14 ENCOUNTER — Ambulatory Visit (INDEPENDENT_AMBULATORY_CARE_PROVIDER_SITE_OTHER): Admitting: Orthopedic Surgery

## 2024-01-14 ENCOUNTER — Encounter: Payer: Self-pay | Admitting: Orthopedic Surgery

## 2024-01-14 DIAGNOSIS — M1711 Unilateral primary osteoarthritis, right knee: Secondary | ICD-10-CM

## 2024-01-14 DIAGNOSIS — M1712 Unilateral primary osteoarthritis, left knee: Secondary | ICD-10-CM

## 2024-01-14 DIAGNOSIS — M170 Bilateral primary osteoarthritis of knee: Secondary | ICD-10-CM | POA: Diagnosis not present

## 2024-01-14 NOTE — Progress Notes (Signed)
 Return patient Visit  Assessment: Gary Burke is a 63 y.o. male with the following: 1.  Bilateral knee arthritis  Plan: Gary Burke has moderate degenerative changes in bilateral knees.  Injections continue to help.  He is interested in repeat injections today.  We also discussed the possibility of hyaluronic acid injections.  He is aware that he will need to contact the clinic in order to get prior authorizations.  Injections were completed without issues.  He will follow-up no sooner than 3 months.  Procedure note injection Right knee joint   Verbal consent was obtained to inject the right knee joint  Timeout was completed to confirm the site of injection.  The skin was prepped with alcohol and ethyl chloride was sprayed at the injection site.  A 21-gauge needle was used to inject 40 mg of Depo-Medrol and 1% lidocaine (3 cc) into the right knee using an anterolateral approach.  There were no complications. A sterile bandage was applied.   Procedure note injection Left knee joint   Verbal consent was obtained to inject the left knee joint  Timeout was completed to confirm the site of injection.  The skin was prepped with alcohol and ethyl chloride was sprayed at the injection site.  A 21-gauge needle was used to inject 40 mg of Depo-Medrol and 1% lidocaine (3 cc) into the left knee using an anterolateral approach.  There were no complications. A sterile bandage was applied.     Follow-up: Return if symptoms worsen or fail to improve.  Subjective:  Chief Complaint  Patient presents with   Follow-up    Recheck on bilateral knees    History of Present Illness: Gary Burke is a 63 y.o. male who returns to clinic for repeat evaluation of bilateral knees.  I have seen him in clinic several times for both knees.  Injections have been very effective for both knees.  He states the right knee is worse than the left.  He is complaining of pain in the medial right knee.  He is  interested in possibility of hyaluronic acid injections. Review of Systems: No fevers or chills No numbness or tingling No chest pain No shortness of breath No bowel or bladder dysfunction No GI distress No headaches   Objective: There were no vitals taken for this visit.  Physical Exam:  General: Alert and oriented. and No acute distress. Gait: Right sided antalgic gait.  Bilateral knees without effusion.  Tenderness to palpation in the medial knee.  Mild varus alignment clinically.  This does react, with a little bit of laxity.  Negative Lachman bilaterally.  IMAGING: No new imaging obtained today   New Medications:  No orders of the defined types were placed in this encounter.     Oliver Barre, MD  01/14/2024 10:54 AM

## 2024-01-14 NOTE — Patient Instructions (Signed)
 Note for work -okay to return in 01/16/2024 without restrictions  Instructions Following Joint Injections  In clinic today, you received an injection in one of your joints (sometimes more than one).  Occasionally, you can have some pain at the injection site, this is normal.  You can place ice at the injection site, or take over-the-counter medications such as Tylenol (acetaminophen) or Advil (ibuprofen).  Please follow all directions listed on the bottle.  If your joint (knee or shoulder) becomes swollen, red or very painful, please contact the clinic for additional assistance.   Two medications were injected, including lidocaine and a steroid (often referred to as cortisone).  Lidocaine is effective almost immediately but wears off quickly.  However, the steroid can take a few days to improve your symptoms.  In some cases, it can make your pain worse for a couple of days.  Do not be concerned if this happens as it is common.  You can apply ice or take some over-the-counter medications as needed.   Injections in the same joint cannot be repeated for 3 months.  This helps to limit the risk of an infection in the joint.  If you were to develop an infection in your joint, the best treatment option would be surgery.

## 2024-02-06 ENCOUNTER — Ambulatory Visit (INDEPENDENT_AMBULATORY_CARE_PROVIDER_SITE_OTHER): Payer: Medicaid Other | Admitting: Nurse Practitioner

## 2024-02-06 ENCOUNTER — Encounter (INDEPENDENT_AMBULATORY_CARE_PROVIDER_SITE_OTHER): Payer: Self-pay | Admitting: Nurse Practitioner

## 2024-02-06 ENCOUNTER — Ambulatory Visit (INDEPENDENT_AMBULATORY_CARE_PROVIDER_SITE_OTHER): Payer: Medicaid Other

## 2024-02-06 ENCOUNTER — Encounter: Payer: Self-pay | Admitting: Gastroenterology

## 2024-02-06 VITALS — BP 134/91 | HR 93 | Resp 18 | Ht 72.0 in | Wt 265.0 lb

## 2024-02-06 DIAGNOSIS — I83893 Varicose veins of bilateral lower extremities with other complications: Secondary | ICD-10-CM | POA: Diagnosis not present

## 2024-02-06 DIAGNOSIS — E119 Type 2 diabetes mellitus without complications: Secondary | ICD-10-CM

## 2024-02-06 DIAGNOSIS — I10 Essential (primary) hypertension: Secondary | ICD-10-CM

## 2024-02-09 ENCOUNTER — Encounter (INDEPENDENT_AMBULATORY_CARE_PROVIDER_SITE_OTHER): Payer: Self-pay | Admitting: Nurse Practitioner

## 2024-02-09 NOTE — Progress Notes (Signed)
 Subjective:    Patient ID: Gary Burke, male    DOB: [birth year], 63 y.o.   MRN: [id number] Chief Complaint  Patient presents with   New Patient (Initial Visit)    Ref Hazeline Lister consult ble varicose veins    Gary Burke is a 63 year old male who presents today for evaluation of varicose veins.  He notes that this started after initially noticing a bump on his left calf.  He has noticed some more bumps since that happened.  He denies any pain in the area.  He denies any open wounds or ulcerations.  He denies any significant swelling either.  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  Never had any intervention for his lower extremities.  Today noninvasive study showed no evidence of DVT or superficial phlebitis bilaterally.  No evidence of superficial venous reflux seen on the right.  A small amount of superficial reflux seen in the left great saphenous vein.    Review of Systems  Cardiovascular:  Negative for leg swelling.  Skin:  Negative for wound.  All other systems reviewed and are negative.      Objective:   Physical Exam Vitals reviewed.  HENT:     Head: Normocephalic.  Cardiovascular:     Rate and Rhythm: Normal rate.     Pulses: Normal pulses.  Pulmonary:     Effort: Pulmonary effort is normal.  Skin:    General: Skin is warm and dry.  Neurological:     Mental Status: He is alert and oriented to person, place, and time.  Psychiatric:        Mood and Affect: Mood normal.        Behavior: Behavior normal.        Thought Content: Thought content normal.        Judgment: Judgment normal.     BP (!) 134/91   Pulse 93   Resp 18   Ht 6' (1.829 m)   Wt 265 lb (120.2 kg)   BMI 35.94 kg/m   Past Medical History:  Diagnosis Date   Bipolar disorder (HCC)    COPD (chronic obstructive pulmonary disease) (HCC)    Diabetes mellitus without complication (HCC) 04/2016   Hypertension    Pulmonary embolism (HCC)    L lung x2   Sleep apnea      Social History   Socioeconomic History   Marital status: Married    Spouse name: Not on file   Number of children: Not on file   Years of education: Not on file   Highest education level: Not on file  Occupational History   Not on file  Tobacco Use   Smoking status: Former    Current packs/day: 0.00    Average packs/day: 0.5 packs/day for 37.0 years (18.5 ttl pk-yrs)    Types: Cigarettes    Start date: 10/29/1977    Quit date: 10/29/2014    Years since quitting: 9.2   Smokeless tobacco: Never  Substance and Sexual Activity   Alcohol use: No   Drug use: No   Sexual activity: Yes    Birth control/protection: None  Other Topics Concern   Not on file  Social History Narrative   Not on file   Social Drivers of Health   Financial Resource Strain: Low Risk  (02/20/2023)   Received from [facility], [facility]   Overall Financial Resource Strain (CARDIA)    Difficulty of Paying Living Expenses: Not  hard at all  Food Insecurity: No Food Insecurity (11/11/2023)   Hunger Vital Sign    Worried About Running Out of Food in the Last Year: Never true    Ran Out of Food in the Last Year: Never true  Transportation Needs: No Transportation Needs (11/11/2023)   PRAPARE - Administrator, Civil Service (Medical): No    Lack of Transportation (Non-Medical): No  Physical Activity: Not on file  Stress: No Stress Concern Present (02/20/2023)   Received from [facility], [facility]   [facility] - Occupational Stress Questionnaire    Feeling of Stress : Not at all  Social Connections: Not on file  Intimate Partner Violence: Not At Risk (11/11/2023)   Humiliation, Afraid, Rape, and Kick questionnaire    Fear of Current or Ex-Partner: No    Emotionally Abused: No    Physically Abused: No    Sexually Abused: No    Past Surgical History:  Procedure Laterality Date   APPENDECTOMY  age 59    Family History  Problem Relation Age of  Onset   Diabetes Daughter        Diabetes Type 1   Colon cancer Neg Hx     Allergies  Allergen Reactions   Penicillins Rash    Has patient had a PCN reaction causing immediate rash, facial/tongue/throat swelling, SOB or lightheadedness with hypotension: Yes Has patient had a PCN reaction causing severe rash involving mucus membranes or skin necrosis: No Has patient had a PCN reaction that required hospitalization No Has patient had a PCN reaction occurring within the last 10 years: No  If all of the above answers are "NO", then may proceed with Cephalosporin use.        Latest Ref Rng & Units 11/11/2023   10:52 AM 04/19/2023   11:17 AM 10/12/2016   10:11 PM  CBC  WBC 4.0 - 10.5 K/uL 9.6  9.4  9.7   Hemoglobin 13.0 - 17.0 g/dL 45.4  09.8  11.9   Hematocrit 39.0 - 52.0 % 44.8  42.6  44.9   Platelets 150 - 400 K/uL 254  190  199       CMP     Component Value Date/Time   NA 132 (L) 04/19/2023 1117   K 3.9 04/19/2023 1117   CL 100 04/19/2023 1117   CO2 23 04/19/2023 1117   GLUCOSE 395 (H) 04/19/2023 1117   BUN 20 04/19/2023 1117   BUN 19 03/26/2023 0000   CREATININE 0.88 04/19/2023 1117   CREATININE 1.07 09/26/2016 0901   CALCIUM 9.2 04/19/2023 1117   PROT 7.2 04/19/2023 1117   ALBUMIN 4.1 04/19/2023 1117   AST 17 04/19/2023 1117   ALT 18 04/19/2023 1117   ALKPHOS 54 04/19/2023 1117   BILITOT 1.2 04/19/2023 1117   EGFR 75 03/26/2023 0000   GFRNONAA >60 04/19/2023 1117   GFRNONAA 69 05/30/2016 1018     No results found.     Assessment & Plan:   1. Varicose veins of bilateral lower extremities with other complications (Primary)  Recommend:  The patient has large symptomatic varicose veins that are painful and associated with swelling. The patient is CEAP [id number]   I have had a long discussion with the patient regarding  varicose veins and why they cause symptoms.  Patient will begin wearing graduated compression stockings class 1 on a daily basis, beginning  first thing in the morning and removing them in the evening.  The patient is instructed specifically not to sleep in the stockings.    The patient  will also begin using over-the-counter analgesics such as Motrin 600 mg po TID to help control the symptoms.    In addition, behavioral modification including elevation during the day will be initiated.    Further plans will be based on the ultrasound results and whether conservative therapies are successful.  Patient will return in 6 months  2. Essential hypertension, benign Continue antihypertensive medications as already ordered, these medications have been reviewed and there are no changes at this time.  3. Controlled type 2 diabetes mellitus without complication, unspecified whether long term insulin use (HCC) Continue hypoglycemic medications as already ordered, these medications have been reviewed and there are no changes at this time.  Hgb A1C to be monitored as already arranged by primary service   Current Outpatient Medications on File Prior to Visit  Medication Sig Dispense Refill   albuterol (PROVENTIL) (2.5 MG/3ML) 0.083% nebulizer solution Take 3 mLs (2.5 mg total) by nebulization every 6 (six) hours as needed for wheezing or shortness of breath. 75 mL 0   albuterol (VENTOLIN HFA) 108 (90 Base) MCG/ACT inhaler Inhale 1-2 puffs into the lungs every 6 (six) hours as needed for wheezing or shortness of breath. 3 each 0   apixaban (ELIQUIS) 5 MG TABS tablet Take 1 tablet (5 mg total) by mouth 2 (two) times daily. 180 tablet 3   atorvastatin (LIPITOR) 40 MG tablet Take 1 tablet (40 mg total) by mouth daily. 90 tablet 0   fenofibrate (TRICOR) 145 MG tablet Take 145 mg by mouth daily.     fluticasone-salmeterol (ADVAIR) 500-50 MCG/ACT AEPB Inhale 1 puff into the lungs in the morning and at bedtime. 3 each 0   hydrocortisone 2.5 % cream Apply topically 3 (three) times daily.     insulin glargine (LANTUS SOLOSTAR) 100 UNIT/ML Solostar Pen  Inject 75 Units into the skin daily. 60 mL 3   Insulin Pen Needle (PEN NEEDLES) 31G X 6 MM MISC Use to inject insulin once daily 100 each 3   levocetirizine (XYZAL) 5 MG tablet Take 5 mg by mouth every evening.     losartan (COZAAR) 25 MG tablet Take 1 tablet (25 mg total) by mouth daily. 90 tablet 0   meclizine (ANTIVERT) 25 MG tablet Take 25 mg by mouth 3 (three) times daily as needed for dizziness or nausea.     metoprolol tartrate (LOPRESSOR) 25 MG tablet Take 1 tablet (25 mg total) by mouth 2 (two) times daily. 180 tablet 0   pantoprazole (PROTONIX) 40 MG tablet Take 40 mg by mouth daily.     Semaglutide, 1 MG/DOSE, 4 MG/3ML SOPN Inject 1 mg as directed once a week. 9 mL 1   Sod Picosulfate-Mag Ox-Cit Acd (CLENPIQ) 10-3.5-12 MG-GM -GM/175ML SOLN Take 1 kit by mouth as directed. 350 mL 0   tamsulosin (FLOMAX) 0.4 MG CAPS capsule Take 1 capsule (0.4 mg total) by mouth daily. (Patient taking differently: Take 0.4 mg by mouth daily. As needed) 30 capsule 4   No current facility-administered medications on file prior to visit.    There are no Patient Instructions on file for this visit. No follow-ups on file.   Monti Jilek E Doralyn Kirkes, NP

## 2024-02-19 ENCOUNTER — Telehealth: Payer: Self-pay | Admitting: *Deleted

## 2024-02-19 ENCOUNTER — Encounter: Payer: Self-pay | Admitting: Gastroenterology

## 2024-02-19 ENCOUNTER — Ambulatory Visit (INDEPENDENT_AMBULATORY_CARE_PROVIDER_SITE_OTHER): Admitting: Gastroenterology

## 2024-02-19 VITALS — BP 139/89 | HR 80 | Temp 97.9°F | Ht 72.0 in | Wt 269.4 lb

## 2024-02-19 DIAGNOSIS — Z860101 Personal history of adenomatous and serrated colon polyps: Secondary | ICD-10-CM

## 2024-02-19 DIAGNOSIS — K760 Fatty (change of) liver, not elsewhere classified: Secondary | ICD-10-CM | POA: Diagnosis not present

## 2024-02-19 DIAGNOSIS — Z860100 Personal history of colon polyps, unspecified: Secondary | ICD-10-CM

## 2024-02-19 NOTE — Progress Notes (Signed)
 GI Office Note    Referring Provider: Gwenyth Leo, FNP Primary Care Physician:  Bucio, Elsa C, FNP  Primary Gastroenterologist: Rheba Cedar, MD   Chief Complaint   Chief Complaint  Patient presents with   Colonoscopy    History of Present Illness   Gary Burke is a 63 y.o. male presenting today to reschedule his missed colonoscopy.  He had to cancel last year because he was sick.  He was last seen in the office in July 2024.  He has a history of adenomatous colon polypson several colonoscopies done in [place]. His last colonoscopy was two years ago at which time he had 5 polyps removed with several tubular adenomas. No FH colon cancer. He reports personal history of pulmonary emboli twice, he states reported once while in [place] several years ago and then once at [facility] (I don't seen any imaging at [facility]). He was on Xarelto for years but due to insurance coverage he was switched to Eliquis . He believes he saw hematologist while in [place]. He really is unsure as to the source of the PEs. Review of one pulmonology note in 2016 states they felt like he did not have PE on one of the concerning studies. Thought it was shadowing. States he typically holds his anticoagulation for colonoscopy. Did not tolerate gallon bowel prep in the past.   Since we last saw him he was developed a right lower extremity DVT 09/2023 while on Eliquis . Increased eliquis  to three times a day while inpatient and now BID. Saw vascular yesterday, u/s showed no evidence of persistent clot. He was seen predominantly for varicose veins. Patient states he has appt tomorrow with either cardiology or vascular in [place]?  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   From GI standpoint, he has no concerns. BMs regular. No melena, brbpr. No abdominal pain. No heartburn, dysphagia.     Labs 12/2023: White blood cell count 11,200, hemoglobin 14.4, platelets 232,000, sodium 143, potassium 4.6, BUN 18, creatinine 0.95, albumin 3.7, total  bilirubin 0.7, AST 21, ALT 23, alk phos 74,  MRI liver with and without contrast 06/2023: IMPRESSION: Diffuse hepatic steatosis with focal fatty sparing along the gallbladder fossa and a focus of fatty deposition in the posterior aspect of the left lobe of the liver which corresponds with the lesion seen on prior CT. No suspicious hepatic lesion.  Colonoscopy August 2022: [facility] -2 sessile polyps, small, removed from ascending colon, tubular adenomas -3 sessile polyps, small, removed from the sigmoid colon, hyperplastic -Few small mouth diverticula in the sigmoid colon   Colonoscopy August 2019: [facility] -1 diminutive polyp in the cecum removed, tubular adenoma -Medium polyp in the sigmoid colon removed, tubular adenoma  Medications   Current Outpatient Medications  Medication Sig Dispense Refill   ACCU-CHEK GUIDE TEST test strip 1 each by Other route 2 (two) times daily.     albuterol  (PROVENTIL ) (2.5 MG/3ML) 0.083% nebulizer solution Take 3 mLs (2.5 mg total) by nebulization every 6 (six) hours as needed for wheezing or shortness of breath. 75 mL 0   albuterol  (VENTOLIN  HFA) 108 (90 Base) MCG/ACT inhaler Inhale 1-2 puffs into the lungs every 6 (six) hours as needed for wheezing or shortness of breath. 3 each 0   apixaban  (ELIQUIS ) 5 MG TABS tablet Take 1 tablet (5 mg total) by mouth 2 (two) times daily. 180 tablet 3   atorvastatin  (LIPITOR) 40 MG tablet Take 1 tablet (40 mg total) by mouth daily.  90 tablet 0   fenofibrate (TRICOR) 145 MG tablet Take 145 mg by mouth daily.     fluticasone -salmeterol (ADVAIR) 500-50 MCG/ACT AEPB Inhale 1 puff into the lungs in the morning and at bedtime. 3 each 0   hydrocortisone 2.5 % cream Apply topically 3 (three) times daily.     insulin  glargine (LANTUS  SOLOSTAR) 100 UNIT/ML Solostar Pen Inject 75 Units into the skin daily. 60 mL 3   Insulin  Pen Needle (PEN NEEDLES) 31G X 6 MM MISC Use to inject insulin  once daily 100 each 3    levocetirizine (XYZAL) 5 MG tablet Take 5 mg by mouth every evening.     losartan  (COZAAR ) 25 MG tablet Take 1 tablet (25 mg total) by mouth daily. 90 tablet 0   meclizine (ANTIVERT) 25 MG tablet Take 25 mg by mouth 3 (three) times daily as needed for dizziness or nausea.     metFORMIN (GLUCOPHAGE) 500 MG tablet Take 500 mg by mouth daily.     metoprolol  tartrate (LOPRESSOR ) 25 MG tablet Take 1 tablet (25 mg total) by mouth 2 (two) times daily. 180 tablet 0   nystatin (MYCOSTATIN/NYSTOP) powder Apply 1 Application topically 2 (two) times daily.     OZEMPIC , 2 MG/DOSE, 8 MG/3ML SOPN Inject 2 mg into the skin once a week.     pantoprazole (PROTONIX) 40 MG tablet Take 40 mg by mouth daily.     tamsulosin  (FLOMAX ) 0.4 MG CAPS capsule Take 1 capsule (0.4 mg total) by mouth daily. (Patient taking differently: Take 0.4 mg by mouth daily. As needed) 30 capsule 4   Sod Picosulfate-Mag Ox-Cit Acd (CLENPIQ ) 10-3.5-12 MG-GM -GM/175ML SOLN Take 1 kit by mouth as directed. (Patient not taking: Reported on 02/19/2024) 350 mL 0   No current facility-administered medications for this visit.    Allergies   Allergies as of 02/19/2024 - Review Complete 02/19/2024  Allergen Reaction Noted   Penicillins Rash 07/08/2012    Past Medical History   Past Medical History:  Diagnosis Date   Bipolar disorder (HCC)    COPD (chronic obstructive pulmonary disease) (HCC)    Diabetes mellitus without complication (HCC) 04/2016   Hypertension    Pulmonary embolism (HCC)    L lung x2   Sleep apnea     Past Surgical History   Past Surgical History:  Procedure Laterality Date   APPENDECTOMY  age 34    Past Family History   Family History  Problem Relation Age of Onset   Diabetes Daughter        Diabetes Type 1   Colon cancer Neg Hx     Past Social History   Social History   Socioeconomic History   Marital status: Married    Spouse name: Not on file   Number of children: Not on file   Years of  education: Not on file   Highest education level: Not on file  Occupational History   Not on file  Tobacco Use   Smoking status: Former    Current packs/day: 0.00    Average packs/day: 0.5 packs/day for 37.0 years (18.5 ttl pk-yrs)    Types: Cigarettes    Start date: 10/29/1977    Quit date: 10/29/2014    Years since quitting: 9.3   Smokeless tobacco: Never  Substance and Sexual Activity   Alcohol use: No   Drug use: No   Sexual activity: Yes    Birth control/protection: None  Other Topics Concern   Not on file  Social  History Narrative   Not on file   Social Drivers of Health   Financial Resource Strain: Low Risk  (02/20/2023)   Received from [facility], [facility]   Overall Financial Resource Strain (CARDIA)    Difficulty of Paying Living Expenses: Not hard at all  Food Insecurity: No Food Insecurity (11/11/2023)   Hunger Vital Sign    Worried About Running Out of Food in the Last Year: Never true    Ran Out of Food in the Last Year: Never true  Transportation Needs: No Transportation Needs (11/11/2023)   PRAPARE - Administrator, Civil Service (Medical): No    Lack of Transportation (Non-Medical): No  Physical Activity: Not on file  Stress: No Stress Concern Present (02/20/2023)   Received from [facility], [facility]   [facility] - Occupational Stress Questionnaire    Feeling of Stress : Not at all  Social Connections: Not on file  Intimate Partner Violence: Not At Risk (11/11/2023)   Humiliation, Afraid, Rape, and Kick questionnaire    Fear of Current or Ex-Partner: No    Emotionally Abused: No    Physically Abused: No    Sexually Abused: No    Review of Systems   General: Negative for anorexia, weight loss, fever, chills, fatigue, weakness. Eyes: Negative for vision changes.  ENT: Negative for hoarseness, difficulty swallowing , nasal congestion. CV: Negative for chest pain, angina, palpitations, dyspnea  on exertion, peripheral edema.  Respiratory: Negative for dyspnea at rest, dyspnea on exertion, cough, sputum, wheezing.  GI: See history of present illness. GU:  Negative for dysuria, hematuria, urinary incontinence, urinary frequency, nocturnal urination.  MS: Negative for joint pain, low back pain.  Derm: Negative for rash or itching.  Neuro: Negative for weakness, abnormal sensation, seizure, frequent headaches, memory loss,  confusion.  Psych: Negative for anxiety, depression, suicidal ideation, hallucinations.  Endo: Negative for unusual weight change.  Heme: Negative for bruising or bleeding. Allergy: Negative for rash or hives.  Physical Exam   BP 139/89 (BP Location: Right Arm, Patient Position: Sitting, Cuff Size: Large)   Pulse 80   Temp 97.9 F (36.6 C) (Oral)   Ht 6' (1.829 m)   Wt 269 lb 6.4 oz (122.2 kg)   SpO2 95%   BMI 36.54 kg/m    General: Well-nourished, well-developed in no acute distress.  Head: Normocephalic, atraumatic.   Eyes: Conjunctiva pink, no icterus. Mouth: Oropharyngeal mucosa moist and pink ,  Neck: Supple without thyromegaly, masses, or lymphadenopathy.  Lungs: Clear to auscultation bilaterally.  Heart: Regular rate and rhythm, no murmurs rubs or gallops.  Abdomen: Bowel sounds are normal, nontender, nondistended, no hepatosplenomegaly or masses,  no abdominal bruits or hernia, no rebound or guarding.   Rectal: not performed Extremities: No lower extremity edema. No clubbing or deformities.  Neuro: Alert and oriented x 4 , grossly normal neurologically.  Skin: Warm and dry, no rash or jaundice.   Psych: Alert and cooperative, normal mood and affect.  Labs   See hpi  Imaging Studies   VAS US  LOWER EXTREMITY VENOUS REFLUX Result Date: 02/12/2024  Lower Venous Reflux Study Patient Name:  DAINEL ARCIDIACONO  Date of Exam:   02/06/2024 Medical Rec #: [id number]     Accession #:    [id number] Date of Birth: [birth year]      Patient Gender: M Patient  Age:   45 years Exam Location:  [facility] Procedure:  VAS US  LOWER EXTREMITY VENOUS REFLUX Referring Phys: Sharla Davis --------------------------------------------------------------------------------  Indications: Varicosities.  Anticoagulation: Eliquis . Performing Technologist: Faustine Hoof RVT  Examination Guidelines: A complete evaluation includes B-mode imaging, spectral Doppler, color Doppler, and power Doppler as needed of all accessible portions of each vessel. Bilateral testing is considered an integral part of a complete examination. Limited examinations for reoccurring indications may be performed as noted. The reflux portion of the exam is performed with the patient in reverse Trendelenburg. Significant venous reflux is defined as >500 ms in the superficial venous system, and >1 second in the deep venous system.  +--------------+---------+------+-----------+------------+--------+ LEFT          Reflux NoRefluxReflux TimeDiameter cmsComments                         Yes                                  +--------------+---------+------+-----------+------------+--------+ GSV at SFJ    no                            .53              +--------------+---------+------+-----------+------------+--------+ GSV prox thigh          yes    >500 ms      .51              +--------------+---------+------+-----------+------------+--------+   Summary: Bilateral: - No evidence of deep vein thrombosis seen in the lower extremities, bilaterally, from the common femoral through the popliteal veins. - No evidence of superficial venous thrombosis in the lower extremities, bilaterally. - No evidence of deep venous insufficiency seen bilaterally in the lower extremity. - No evidence of superficial venous reflux seen in the short saphenous veins bilaterally.  Right: - There is no evidence of venous reflux seen in the right lower extremity.  Left: - Venous reflux is noted in the left  greater saphenous vein in the thigh.  *See table(s) above for measurements and observations. Electronically signed by Devon Fogo MD on 02/12/2024 at 10:19:56 AM.    Final     Assessment/Plan:   H/o adenomatous colon polyps: [place], each time having several polyps. Previously discussed with Dr. Riley Cheadle, would recommend updated colonoscopy at this time. Patient had to cancel last year for illness. He is anticoagulated for history of blood clots. 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 This has since resolved. We will reach out to PCP and hematology prior to holding Eliquis .  -ASA 3.  I have discussed the risks, alternatives, benefits with regards to but not limited to the risk of reaction to medication, bleeding, infection, perforation and the patient is agreeable to proceed. Written consent to be obtained.    Fatty liver: -FIB-4, advanced fibrosis excluded -NAFLD 0.7 F3-F4 -consider elastography at follow up visit.  Instructions for fatty liver: Recommend 1-2# weight loss per week until ideal body weight through exercise & diet. Low fat/cholesterol diet.   Avoid sweets, sodas, fruit juices, sweetened beverages like tea, etc. Gradually increase exercise from 15 min daily up to 1 hr per day 5 days/week. Limit alcohol use.       Trudie Fuse. Harles Lied, MHS, PA-C [facility]

## 2024-02-19 NOTE — Telephone Encounter (Signed)
  Request for patient to stop medication prior to procedure or is needing cleareance  02/19/24  Gary Burke December 08, 1960  What type of surgery is being performed? Colonoscopy   When is surgery scheduled? TBD  What type of clearance is required (medical or pharmacy to hold medication or both? medication  Are there any medications that need to be held prior to surgery and how long? Eliquis  x 2 days  Name of physician performing surgery?  Dr. Colton [facility] at [facility] Phone: [phone] Fax: [phone]  Anethesia type (none, local, MAC, general)? MAC

## 2024-02-19 NOTE — Patient Instructions (Signed)
 We will be in touch to schedule colonoscopy once we get approval to hold your Eliquis .

## 2024-02-28 ENCOUNTER — Ambulatory Visit: Admitting: Podiatry

## 2024-03-02 NOTE — Telephone Encounter (Signed)
 Clearance from PCP scanned under media tab.

## 2024-03-02 NOTE — Telephone Encounter (Signed)
  Do you have all the other instructions ie DM meds, holding ozempic  for one week?   When you get pt scheduled, please let patient know that I spoke to hematology, Dr. Cheree Cords as well, given recurrent unprovoked blood clots, he suggested we bridge with Lovenox 1.5 mg/kg, holding 24 hours before colonoscopy and resume Eliquis  after colonoscopy.  I will provide instructions and RX.

## 2024-03-03 NOTE — Telephone Encounter (Signed)
 I do have the encounter form with medication instructions.

## 2024-03-03 NOTE — Telephone Encounter (Signed)
 Pt has been scheduled for 04/03/24. Instructions mailed. He still has prep from last time and he says that expiration date is still in date.

## 2024-03-03 NOTE — Telephone Encounter (Signed)
 When you have a date for procedure, let me know and I will provide instructions as when to hold eliquis  and start lovenox.

## 2024-03-04 ENCOUNTER — Encounter: Payer: Self-pay | Admitting: *Deleted

## 2024-03-04 MED ORDER — ENOXAPARIN SODIUM 150 MG/ML IJ SOSY: PREFILLED_SYRINGE | INTRAMUSCULAR | 1 refills | Status: DC

## 2024-03-04 MED ORDER — ENOXAPARIN SODIUM 30 MG/0.3ML IJ SOSY: PREFILLED_SYRINGE | INTRAMUSCULAR | 1 refills | Status: DC

## 2024-03-04 NOTE — Telephone Encounter (Signed)
 Instructions mailed with medication instructions regarding Eliquis  and Lovenox.

## 2024-03-04 NOTE — Telephone Encounter (Addendum)
 Please follow the Medication Instructions below for your procedure:   You need to hold your Eliquis  48 hours before your procedure and start a Lovenox bridge.   Your procedure is scheduled for 04/03/24. RX for Lovenox has been sent to your pharmacy. Please pick up asap in case they have to order it, etc.           04/01/24    Last dose of Eliquis     Take Eliquis  at AM Do Not take PM dose     04/02/24   No Eliquis     Begin Lovenox/enoxaparin [place] 180 mg at 8 am (you will need to take the 30mg  shot and the 150mg  shot to add up to 180mg )       04/03/2024 (day of procedure) No Eliquis   No Lovenox              Dr. Riley Cheadle will provide you with further instructions about your Eliquis  and Lovenox after your procedure.       You do have extra shots to use after procedure if needed.

## 2024-03-04 NOTE — Addendum Note (Signed)
 Addended by: Lanney Pitts on: 03/04/2024 09:58 AM   Modules accepted: Orders

## 2024-03-05 ENCOUNTER — Ambulatory Visit: Admitting: Podiatry

## 2024-03-17 ENCOUNTER — Encounter (INDEPENDENT_AMBULATORY_CARE_PROVIDER_SITE_OTHER): Payer: Self-pay

## 2024-03-26 ENCOUNTER — Encounter: Payer: Self-pay | Admitting: Podiatry

## 2024-03-26 ENCOUNTER — Ambulatory Visit (INDEPENDENT_AMBULATORY_CARE_PROVIDER_SITE_OTHER): Admitting: Podiatry

## 2024-03-26 DIAGNOSIS — M79675 Pain in left toe(s): Secondary | ICD-10-CM | POA: Diagnosis not present

## 2024-03-26 DIAGNOSIS — E1142 Type 2 diabetes mellitus with diabetic polyneuropathy: Secondary | ICD-10-CM | POA: Diagnosis not present

## 2024-03-26 DIAGNOSIS — M79674 Pain in right toe(s): Secondary | ICD-10-CM | POA: Diagnosis not present

## 2024-03-26 DIAGNOSIS — B353 Tinea pedis: Secondary | ICD-10-CM

## 2024-03-26 DIAGNOSIS — B351 Tinea unguium: Secondary | ICD-10-CM | POA: Diagnosis not present

## 2024-03-26 MED ORDER — KETOCONAZOLE 2 % EX CREA: 1.0000 | TOPICAL_CREAM | Freq: Every day | CUTANEOUS | 2 refills | Status: AC

## 2024-03-26 NOTE — Progress Notes (Signed)
  Subjective:  Patient ID: Gary Burke, male    DOB: [birth year],  MRN: [id number]  Chief Complaint  Patient presents with   diabetic foot care    Rm16/ Diabetic foot exam/IDDM A1C 8.2     63 y.o. male presents with the above complaint. History confirmed with patient. Patient presenting with pain related to dystrophic thickened elongated nails. Patient is unable to trim own nails related to nail dystrophy and/or mobility issues. Patient does have a history of T2DM.  Last A1c approximately 8.2.  She denies history of pedal ulceration.  Does report occasional numbness and tingling in his feet.    Objective:  Physical Exam: warm, good capillary refill, pedal hair with present nail exam onychomycosis of the toenails, onycholysis, and dystrophic nails DP pulses palpable, PT pulses palpable, protective sensation intact, and vibratory sensation diminished Left Foot:  Pain with palpation of nails due to elongation and dystrophic growth.  Right Foot: Pain with palpation of nails due to elongation and dystrophic growth.  Pedal skin bilaterally is flaky and xerotic in moccasin distribution Musculoskeletal: No significant pedal deformity bilaterally with muscle strength 5/5 for all major muscles.  No symptomatic limitations in pedal range of motion.  Assessment:   1. Pain due to onychomycosis of toenails of both feet   2. Diabetic polyneuropathy associated with type 2 diabetes mellitus (HCC)   3. Tinea pedis of both feet      Plan:  Patient was evaluated and treated and all questions answered.  # Tinea pedis of both feet - Overall findings of tinea pedis discussed with patient.  Discussed good pedal hygiene at home, use of clean white socks, thorough cleaning of barefoot surfaces and treatment of shoes with antifungal spray or powder - Sending 2% ketoconazole cream to patient's pharmacy to be applied daily over the next 3 weeks.  Encouraged patient to follow-up if he notices residual rash at the  end of this course.  # Diabetes with neuropathy -Patient educated on diabetes. Discussed proper diabetic foot care and discussed risks and complications of disease. Educated patient in depth on reasons to return to the office immediately should he/she discover anything concerning or new on the feet. All questions answered. Discussed proper shoes as well.  - Some loss of vibratory sensation  #Onychomycosis with pain  -Nails palliatively debrided as below. -Educated on self-care  Procedure: Nail Debridement Rationale: Pain Type of Debridement: manual, sharp debridement. Instrumentation: Nail nipper, rotary burr. Number of Nails: 10  Return in about 3 months (around 06/26/2024) for Diabetic Foot Care.         Eve Hinders, DPM [facility] / [facility]

## 2024-03-30 NOTE — Telephone Encounter (Signed)
 [employer] PA: The prior authorization/notification reference number is: [id number]. The prior authorization/notification case information was transmitted on 03/30/2024 at 12:05 PM CDT

## 2024-03-31 NOTE — Patient Instructions (Signed)
 Gary Burke  03/31/2024     @PREFPERIOPPHARMACY @   Your procedure is scheduled on  04/03/2024.   Report to [facility] at  0800 A.M.   Call this number if you have problems the morning of surgery:  [phone]  If you experience any cold or flu symptoms such as cough, fever, chills, shortness of breath, etc. between now and your scheduled surgery, please notify us  at the above number.   Remember:        Your last dose of eliquis  should have been on 03/31/2024.        Use your nebulizer and your inhaler befpre you come and bring your rescue inhaler with you.        Take 1/2 of your usual night time insulin  the night before your procedure.           DO NOT take any medications for diabetes the morning of your procedure.      Follow the diet and prep instructions given to you by the office.   You may drink clear liquids until  0600 am on 04/03/2024.    Clear liquids allowed are:                    Water, Juice (No red color; non-citric and without pulp; diabetics please choose diet or no sugar options), Carbonated beverages (diabetics please choose diet or no sugar options), Clear Tea (No creamer, milk, or cream, including half & half and powdered creamer), Black Coffee Only (No creamer, milk or cream, including half & half and powdered creamer), and Clear Sports drink (No red color; diabetics please choose diet or no sugar options)    Take these medicines the morning of surgery with A SIP OF WATER                             metoprolol , pantoprazole, tamsulosin .    Do not wear jewelry, make-up or nail polish, including gel polish,  artificial nails, or any other type of covering on natural nails (fingers and  toes).  Do not wear lotions, powders, or perfumes, or deodorant.  Do not shave 48 hours prior to surgery.  Men may shave face and neck.  Do not bring valuables to the hospital.  [facility] is not responsible for any belongings or valuables.  Contacts, dentures or  bridgework may not be worn into surgery.  Leave your suitcase in the car.  After surgery it may be brought to your room.  For patients admitted to the hospital, discharge time will be determined by your treatment team.  Patients discharged the day of surgery will not be allowed to drive home and must have someone with them for 24 hours.    Special instructions:   DO NOT smoke tobacco or vape for 24 hours before your procedure.  Please read over the following fact sheets that you were given. Anesthesia Post-op Instructions and Care and Recovery After Surgery      Colonoscopy, Adult, Care After The following information offers guidance on how to care for yourself after your procedure. Your health care provider may also give you more specific instructions. If you have problems or questions, contact your health care provider. What can I expect after the procedure? After the procedure, it is common to have: A small amount of blood in your stool for 24 hours after the procedure. Some gas. Mild cramping  or bloating of your abdomen. Follow these instructions at home: Eating and drinking  Drink enough fluid to keep your urine pale yellow. Follow instructions from your health care provider about eating or drinking restrictions. Resume your normal diet as told by your health care provider. Avoid heavy or fried foods that are hard to digest. Activity Rest as told by your health care provider. Avoid sitting for a long time without moving. Get up to take short walks every 1-2 hours. This is important to improve blood flow and breathing. Ask for help if you feel weak or unsteady. Return to your normal activities as told by your health care provider. Ask your health care provider what activities are safe for you. Managing cramping and bloating  Try walking around when you have cramps or feel bloated. If directed, apply heat to your abdomen as told by your health care provider. Use the heat source  that your health care provider recommends, such as a moist heat pack or a heating pad. Place a towel between your skin and the heat source. Leave the heat on for 20-30 minutes. Remove the heat if your skin turns bright red. This is especially important if you are unable to feel pain, heat, or cold. You have a greater risk of getting burned. General instructions If you were given a sedative during the procedure, it can affect you for several hours. Do not drive or operate machinery until your health care provider says that it is safe. For the first 24 hours after the procedure: Do not sign important documents. Do not drink alcohol. Do your regular daily activities at a slower pace than normal. Eat soft foods that are easy to digest. Take over-the-counter and prescription medicines only as told by your health care provider. Keep all follow-up visits. This is important. Contact a health care provider if: You have blood in your stool 2-3 days after the procedure. Get help right away if: You have more than a small spotting of blood in your stool. You have large blood clots in your stool. You have swelling of your abdomen. You have nausea or vomiting. You have a fever. You have increasing pain in your abdomen that is not relieved with medicine. These symptoms may be an emergency. Get help right away. Call 911. Do not wait to see if the symptoms will go away. Do not drive yourself to the hospital. Summary After the procedure, it is common to have a small amount of blood in your stool. You may also have mild cramping and bloating of your abdomen. If you were given a sedative during the procedure, it can affect you for several hours. Do not drive or operate machinery until your health care provider says that it is safe. Get help right away if you have a lot of blood in your stool, nausea or vomiting, a fever, or increased pain in your abdomen. This information is not intended to replace advice  given to you by your health care provider. Make sure you discuss any questions you have with your health care provider. Document Revised: 11/27/2022 Document Reviewed: 06/07/2021 Elsevier Patient Education  2024 [employer]General Anesthesia, Adult, Care After The following information offers guidance on how to care for yourself after your procedure. Your health care provider may also give you more specific instructions. If you have problems or questions, contact your health care provider. What can I expect after the procedure? After the procedure, it is common for people to: Have pain or discomfort at  the IV site. Have nausea or vomiting. Have a sore throat or hoarseness. Have trouble concentrating. Feel cold or chills. Feel weak, sleepy, or tired (fatigue). Have soreness and body aches. These can affect parts of the body that were not involved in surgery. Follow these instructions at home: For the time period you were told by your health care provider:  Rest. Do not participate in activities where you could fall or become injured. Do not drive or use machinery. Do not drink alcohol. Do not take sleeping pills or medicines that cause drowsiness. Do not make important decisions or sign legal documents. Do not take care of children on your own. General instructions Drink enough fluid to keep your urine pale yellow. If you have sleep apnea, surgery and certain medicines can increase your risk for breathing problems. Follow instructions from your health care provider about wearing your sleep device: Anytime you are sleeping, including during daytime naps. While taking prescription pain medicines, sleeping medicines, or medicines that make you drowsy. Return to your normal activities as told by your health care provider. Ask your health care provider what activities are safe for you. Take over-the-counter and prescription medicines only as told by your health care provider. Do not use  any products that contain nicotine or tobacco. These products include cigarettes, chewing tobacco, and vaping devices, such as e-cigarettes. These can delay incision healing after surgery. If you need help quitting, ask your health care provider. Contact a health care provider if: You have nausea or vomiting that does not get better with medicine. You vomit every time you eat or drink. You have pain that does not get better with medicine. You cannot urinate or have bloody urine. You develop a skin rash. You have a fever. Get help right away if: You have trouble breathing. You have chest pain. You vomit blood. These symptoms may be an emergency. Get help right away. Call 911. Do not wait to see if the symptoms will go away. Do not drive yourself to the hospital. Summary After the procedure, it is common to have a sore throat, hoarseness, nausea, vomiting, or to feel weak, sleepy, or fatigue. For the time period you were told by your health care provider, do not drive or use machinery. Get help right away if you have difficulty breathing, have chest pain, or vomit blood. These symptoms may be an emergency. This information is not intended to replace advice given to you by your health care provider. Make sure you discuss any questions you have with your health care provider. Document Revised: 01/12/2022 Document Reviewed: 01/12/2022 Elsevier Patient Education  2024 [employer].

## 2024-04-01 ENCOUNTER — Encounter (HOSPITAL_COMMUNITY): Payer: Self-pay

## 2024-04-01 ENCOUNTER — Other Ambulatory Visit: Payer: Self-pay

## 2024-04-01 ENCOUNTER — Encounter (HOSPITAL_COMMUNITY)
Admission: RE | Admit: 2024-04-01 | Discharge: 2024-04-01 | Disposition: A | Discharge status: Home / Self Care | Source: Ambulatory Visit | Attending: Internal Medicine | Admitting: Internal Medicine

## 2024-04-01 VITALS — BP 125/105 | HR 82 | Temp 98.1°F

## 2024-04-01 DIAGNOSIS — I10 Essential (primary) hypertension: Secondary | ICD-10-CM | POA: Insufficient documentation

## 2024-04-01 DIAGNOSIS — R9431 Abnormal electrocardiogram [ECG] [EKG]: Secondary | ICD-10-CM | POA: Diagnosis not present

## 2024-04-01 DIAGNOSIS — Z720 Tobacco use: Secondary | ICD-10-CM | POA: Insufficient documentation

## 2024-04-01 DIAGNOSIS — Z01818 Encounter for other preprocedural examination: Secondary | ICD-10-CM | POA: Diagnosis not present

## 2024-04-01 DIAGNOSIS — Z01810 Encounter for preprocedural cardiovascular examination: Secondary | ICD-10-CM | POA: Diagnosis present

## 2024-04-01 DIAGNOSIS — Z6841 Body Mass Index (BMI) 40.0 and over, adult: Secondary | ICD-10-CM | POA: Insufficient documentation

## 2024-04-01 DIAGNOSIS — E119 Type 2 diabetes mellitus without complications: Secondary | ICD-10-CM | POA: Insufficient documentation

## 2024-04-01 DIAGNOSIS — Z01812 Encounter for preprocedural laboratory examination: Secondary | ICD-10-CM | POA: Diagnosis present

## 2024-04-01 LAB — BASIC METABOLIC PANEL WITH GFR
Anion gap: 8 (ref 5–15)
BUN: 14 mg/dL (ref 8–23)
CO2: 19 mmol/L — ABNORMAL LOW (ref 22–32)
Calcium: 8.7 mg/dL — ABNORMAL LOW (ref 8.9–10.3)
Chloride: 105 mmol/L (ref 98–111)
Creatinine, Ser: 0.89 mg/dL (ref 0.61–1.24)
GFR, Estimated: >60 mL/min (ref >60)
Glucose, Bld: 169 mg/dL — ABNORMAL HIGH (ref 70–99)
Potassium: 3.7 mmol/L (ref 3.5–5.1)
Sodium: 132 mmol/L — ABNORMAL LOW (ref 135–145)

## 2024-04-03 ENCOUNTER — Other Ambulatory Visit: Payer: Self-pay

## 2024-04-03 ENCOUNTER — Encounter (HOSPITAL_COMMUNITY)
Admission: RE | Disposition: A | Discharge status: Home / Self Care | Payer: Self-pay | Source: Home / Self Care | Attending: Internal Medicine

## 2024-04-03 ENCOUNTER — Ambulatory Visit (HOSPITAL_COMMUNITY): Payer: Self-pay | Admitting: Certified Registered Nurse Anesthetist

## 2024-04-03 ENCOUNTER — Ambulatory Visit (HOSPITAL_BASED_OUTPATIENT_CLINIC_OR_DEPARTMENT_OTHER): Payer: Self-pay | Admitting: Certified Registered Nurse Anesthetist

## 2024-04-03 ENCOUNTER — Encounter (HOSPITAL_COMMUNITY): Payer: Self-pay | Admitting: Internal Medicine

## 2024-04-03 ENCOUNTER — Ambulatory Visit (HOSPITAL_COMMUNITY)
Admission: RE | Admit: 2024-04-03 | Discharge: 2024-04-03 | Disposition: A | Discharge status: Home / Self Care | Attending: Internal Medicine | Admitting: Internal Medicine

## 2024-04-03 DIAGNOSIS — Z86718 Personal history of other venous thrombosis and embolism: Secondary | ICD-10-CM | POA: Insufficient documentation

## 2024-04-03 DIAGNOSIS — Z7984 Long term (current) use of oral hypoglycemic drugs: Secondary | ICD-10-CM | POA: Insufficient documentation

## 2024-04-03 DIAGNOSIS — Z87891 Personal history of nicotine dependence: Secondary | ICD-10-CM

## 2024-04-03 DIAGNOSIS — I10 Essential (primary) hypertension: Secondary | ICD-10-CM

## 2024-04-03 DIAGNOSIS — E119 Type 2 diabetes mellitus without complications: Secondary | ICD-10-CM | POA: Diagnosis not present

## 2024-04-03 DIAGNOSIS — J449 Chronic obstructive pulmonary disease, unspecified: Secondary | ICD-10-CM

## 2024-04-03 DIAGNOSIS — Z860101 Personal history of adenomatous and serrated colon polyps: Secondary | ICD-10-CM | POA: Diagnosis not present

## 2024-04-03 DIAGNOSIS — Z7985 Long-term (current) use of injectable non-insulin antidiabetic drugs: Secondary | ICD-10-CM | POA: Insufficient documentation

## 2024-04-03 DIAGNOSIS — Z7901 Long term (current) use of anticoagulants: Secondary | ICD-10-CM | POA: Diagnosis not present

## 2024-04-03 DIAGNOSIS — Z1211 Encounter for screening for malignant neoplasm of colon: Secondary | ICD-10-CM

## 2024-04-03 DIAGNOSIS — F319 Bipolar disorder, unspecified: Secondary | ICD-10-CM | POA: Insufficient documentation

## 2024-04-03 DIAGNOSIS — Z86711 Personal history of pulmonary embolism: Secondary | ICD-10-CM | POA: Diagnosis not present

## 2024-04-03 DIAGNOSIS — Z794 Long term (current) use of insulin: Secondary | ICD-10-CM | POA: Diagnosis not present

## 2024-04-03 DIAGNOSIS — G4730 Sleep apnea, unspecified: Secondary | ICD-10-CM | POA: Insufficient documentation

## 2024-04-03 DIAGNOSIS — Z860100 Personal history of colon polyps, unspecified: Secondary | ICD-10-CM | POA: Diagnosis present

## 2024-04-03 HISTORY — PX: COLONOSCOPY: SHX5424

## 2024-04-03 LAB — GLUCOSE, CAPILLARY: Glucose-Capillary: 132 mg/dL — ABNORMAL HIGH (ref 70–99)

## 2024-04-03 SURGERY — COLONOSCOPY
Anesthesia: General

## 2024-04-03 MED ORDER — EPHEDRINE 5 MG/ML INJ
INTRAVENOUS | Status: AC
  Filled 2024-04-03: qty 5

## 2024-04-03 MED ORDER — PROPOFOL 500 MG/50ML IV EMUL
INTRAVENOUS | Status: AC
  Filled 2024-04-03: qty 50

## 2024-04-03 MED ORDER — PROPOFOL 500 MG/50ML IV EMUL
INTRAVENOUS | Status: DC | PRN
  Administered 2024-04-03: 150 ug/kg/min via INTRAVENOUS
  Administered 2024-04-03: 60 mg via INTRAVENOUS

## 2024-04-03 MED ORDER — LACTATED RINGERS IV SOLN: INTRAVENOUS | Status: DC | PRN

## 2024-04-03 MED ORDER — LACTATED RINGERS IV SOLN: INTRAVENOUS | Status: DC

## 2024-04-03 MED ORDER — SODIUM CHLORIDE FLUSH 0.9 % IV SOLN
INTRAVENOUS | Status: AC
  Filled 2024-04-03: qty 10

## 2024-04-03 NOTE — Anesthesia Postprocedure Evaluation (Signed)
 Anesthesia Post Note  Patient: Gary Burke  Procedure(s) Performed: COLONOSCOPY  Patient location during evaluation: Phase II Anesthesia Type: General Level of consciousness: awake Pain management: pain level controlled Vital Signs Assessment: post-procedure vital signs reviewed and stable Respiratory status: spontaneous breathing and respiratory function stable Cardiovascular status: blood pressure returned to baseline and stable Postop Assessment: no headache and no apparent nausea or vomiting Anesthetic complications: no Comments: Late entry   No notable events documented.   Last Vitals:  Vitals:   04/03/24 1026 04/03/24 1029  BP: 102/69 101/72  Pulse: 83   Resp: 19   Temp: 36.8 C   SpO2: 93% 94%    Last Pain:  Vitals:   04/03/24 1026  TempSrc: Oral  PainSc:                  Coretha Dew

## 2024-04-03 NOTE — Discharge Instructions (Addendum)
    Sigmoidoscopy Discharge Instructions  Read the instructions outlined below and refer to this sheet in the next few weeks. These discharge instructions provide you with general information on caring for yourself after you leave the hospital. Your doctor may also give you specific instructions. While your treatment has been planned according to the most current medical practices available, unavoidable complications occasionally occur. If you have any problems or questions after discharge, call Dr. Riley Cheadle at [phone]. ACTIVITY You may resume your regular activity, but move at a slower pace for the next 24 hours.  Take frequent rest periods for the next 24 hours.  Walking will help get rid of the air and reduce the bloated feeling in your belly (abdomen).  No driving for 24 hours (because of the medicine (anesthesia) used during the test).   Do not sign any important legal documents or operate any machinery for 24 hours (because of the anesthesia used during the test).  NUTRITION Drink plenty of fluids.  You may resume your normal diet as instructed by your doctor.  Begin with a light meal and progress to your normal diet. Heavy or fried foods are harder to digest and may make you feel sick to your stomach (nauseated).  Avoid alcoholic beverages for 24 hours or as instructed.  MEDICATIONS You may resume your normal medications unless your doctor tells you otherwise.  WHAT YOU CAN EXPECT TODAY Some feelings of bloating in the abdomen.  Passage of more gas than usual.  Spotting of blood in your stool or on the toilet paper.  IF YOU HAD POLYPS REMOVED DURING THE COLONOSCOPY: No aspirin products for 7 days or as instructed.  No alcohol for 7 days or as instructed.  Eat a soft diet for the next 24 hours.  FINDING OUT THE RESULTS OF YOUR TEST Not all test results are available during your visit. If your test results are not back during the visit, make an appointment with your caregiver to find out  the results. Do not assume everything is normal if you have not heard from your caregiver or the medical facility. It is important for you to follow up on all of your test results.  SEEK IMMEDIATE MEDICAL ATTENTION IF: You have more than a spotting of blood in your stool.  Your belly is swollen (abdominal distention).  You are nauseated or vomiting.  You have a temperature over 101.  You have abdominal pain or discomfort that is severe or gets worse throughout the day.      you had stool in your rectum and colon.  Your prep was not adequate.  Colonoscopy could not be done  Take the Lovenox  180 mg shot when you get home and 1 tomorrow.    Then no more  Lovenox .  Resume Eliquis  today.    Office visit with [name] in the next 4 to 6 weeks to regroup and reschedule colonoscopy.    At patient request, I called Ginger Weidner [phone]-GEXB rolled to voicemail.  Left a message.

## 2024-04-03 NOTE — H&P (Signed)
 @LOGO @   Primary Care Physician:  Gwenyth Leo, FNP Primary Gastroenterologist:  Dr. Riley Cheadle  Pre-Procedure History & Physical: HPI:  Gary Burke is a 63 y.o. male here for  for high risk screening surveillance colonoscopy history [place].  He is here for surveillance colonoscopy.  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  Past Medical History:  Diagnosis Date   Bipolar disorder (HCC)    COPD (chronic obstructive pulmonary disease) (HCC)    Diabetes mellitus without complication (HCC) 04/2016   DVT (deep vein thrombosis) in pregnancy 09/2023   Hypertension    Pulmonary embolism (HCC)    L lung x2   Sleep apnea     Past Surgical History:  Procedure Laterality Date   APPENDECTOMY  age 89    Prior to Admission medications   Medication Sig Start Date End Date Taking? Authorizing Provider  albuterol  (PROVENTIL ) (2.5 MG/3ML) 0.083% nebulizer solution Take 3 mLs (2.5 mg total) by nebulization every 6 (six) hours as needed for wheezing or shortness of breath. 10/16/22  Yes Luane Rumps, PA-C  albuterol  (VENTOLIN  HFA) 108 (90 Base) MCG/ACT inhaler Inhale 1-2 puffs into the lungs every 6 (six) hours as needed for wheezing or shortness of breath. 10/16/22  Yes Luane Rumps, PA-C  apixaban  (ELIQUIS ) 5 MG TABS tablet Take 1 tablet (5 mg total) by mouth 2 (two) times daily. 10/16/22  Yes Luane Rumps, PA-C  atorvastatin  (LIPITOR) 40 MG tablet Take 1 tablet (40 mg total) by mouth daily. 10/16/22  Yes Luane Rumps, PA-C  losartan  (COZAAR ) 25 MG tablet Take 1 tablet (25 mg total) by mouth daily. 10/16/22  Yes Luane Rumps, PA-C  metFORMIN (GLUCOPHAGE) 500 MG tablet Take 500 mg by mouth daily. 02/10/24  Yes [provider]  metoprolol  tartrate (LOPRESSOR ) 25 MG tablet Take 1 tablet (25 mg total) by mouth 2 (two) times daily. 10/16/22  Yes Luane Rumps, PA-C  OZEMPIC , 2 MG/DOSE,  8 MG/3ML SOPN Inject 2 mg into the skin once a week. 02/10/24  Yes [provider]  pantoprazole (PROTONIX) 40 MG tablet Take 40 mg by mouth daily. 03/26/23  Yes [provider]  tamsulosin  (FLOMAX ) 0.4 MG CAPS capsule Take 1 capsule (0.4 mg total) by mouth daily. 10/16/22  Yes Luane Rumps, PA-C  ACCU-CHEK GUIDE TEST test strip 1 each by Other route 2 (two) times daily. 01/07/24   [provider]  enoxaparin  (LOVENOX ) 150 MG/ML injection Inject 1 mL (150mg ) into the skin on 04/02/24 at 8am along with the 30mg  dose to equal 180mg . You will get further instructions after your procedure. 03/04/24   Lanney Pitts, PA-C  enoxaparin  (LOVENOX ) 30 MG/0.3ML injection Inject 0.3 mLs (30mg ) into the skin on 04/02/24 at 8am along with the 150 mg dose to equal 180mg . You will get further instructions after your procedure. 03/04/24   Lanney Pitts, PA-C  fenofibrate (TRICOR) 145 MG tablet Take 145 mg by mouth daily. 03/27/23   [provider]  fluticasone -salmeterol (ADVAIR) 500-50 MCG/ACT AEPB Inhale 1 puff into the lungs in the morning and at bedtime. 10/16/22   Luane Rumps, PA-C  hydrocortisone 2.5 % cream Apply topically 3 (three) times daily. 04/24/23   [provider]  insulin  glargine (LANTUS  SOLOSTAR) 100 UNIT/ML Solostar Pen Inject 75 Units into the skin daily. 07/11/23   Wendel Hals, NP  Insulin  Pen Needle (PEN NEEDLES) 31G X 6 MM MISC Use to  inject insulin  once daily 07/11/23   Wendel Hals, NP  ketoconazole  (NIZORAL ) 2 % cream Apply 1 Application topically daily. 03/26/24   Reina Cara, DPM  levocetirizine (XYZAL) 5 MG tablet Take 5 mg by mouth every evening.    [provider]  meclizine (ANTIVERT) 25 MG tablet Take 25 mg by mouth 3 (three) times daily as needed for dizziness or nausea. 07/11/22   [provider]  nystatin (MYCOSTATIN/NYSTOP) powder Apply 1 Application topically 2 (two) times daily.    [provider]   Sod Picosulfate-Mag Ox-Cit Acd (CLENPIQ ) 10-3.5-12 MG-GM -GM/175ML SOLN Take 1 kit by mouth as directed. Patient not taking: Reported on 03/26/2024 05/15/23   Suzette Espy, MD    Allergies as of 03/03/2024 - Review Complete 02/19/2024  Allergen Reaction Noted   Penicillins Rash 07/08/2012    Family History  Problem Relation Age of Onset   Diabetes Daughter        Diabetes Type 1   Colon cancer Neg Hx     Social History   Socioeconomic History   Marital status: Married    Spouse name: Not on file   Number of children: Not on file   Years of education: Not on file   Highest education level: Not on file  Occupational History   Not on file  Tobacco Use   Smoking status: Former    Current packs/day: 0.00    Average packs/day: 0.5 packs/day for 37.0 years (18.5 ttl pk-yrs)    Types: Cigarettes    Start date: 10/29/1977    Quit date: 10/29/2014    Years since quitting: 9.4   Smokeless tobacco: Never  Substance and Sexual Activity   Alcohol use: No   Drug use: No   Sexual activity: Yes    Birth control/protection: None  Other Topics Concern   Not on file  Social History Narrative   Not on file   Social Drivers of Health   Financial Resource Strain: Low Risk  (02/20/2023)   Received from [facility], [facility]   Overall Financial Resource Strain (CARDIA)    Difficulty of Paying Living Expenses: Not hard at all  Food Insecurity: No Food Insecurity (11/11/2023)   Hunger Vital Sign    Worried About Running Out of Food in the Last Year: Never true    Ran Out of Food in the Last Year: Never true  Transportation Needs: No Transportation Needs (11/11/2023)   PRAPARE - Administrator, Civil Service (Medical): No    Lack of Transportation (Non-Medical): No  Physical Activity: Not on file  Stress: No Stress Concern Present (02/20/2023)   Received from [facility], [facility] of Occupational Health - Occupational Stress  Questionnaire    Feeling of Stress : Not at all  Social Connections: Not on file  Intimate Partner Violence: Not At Risk (11/11/2023)   Humiliation, Afraid, Rape, and Kick questionnaire    Fear of Current or Ex-Partner: No    Emotionally Abused: No    Physically Abused: No    Sexually Abused: No    Review of Systems: See HPI, otherwise negative ROS  Physical Exam: BP (!) 138/93   Pulse 79   Temp 97.6 F (36.4 C) (Oral)   Resp 18   Ht 6' (1.829 m)   Wt 118.4 kg   SpO2 97%   BMI 35.40 kg/m  General:   Alert,  Well-developed, well-nourished, pleasant and cooperative  in NAD Neck:  Supple; no masses or thyromegaly. No significant cervical adenopathy. Lungs:  Clear throughout to auscultation.   No wheezes, crackles, or rhonchi. No acute distress. Heart:  Regular rate and rhythm; no murmurs, clicks, rubs,  or gallops. Abdomen: Non-distended, normal bowel sounds.  Soft and nontender without appreciable mass or hepatosplenomegaly.   Impression/Plan:    63 year old gentleman with multiple colonic polyps removed over time here for surveillance examination.  Eliquis  held; Lovenox  bridging performed.  Colonoscopy today. The risks, benefits, limitations, alternatives and imponderables have been reviewed with the patient. Questions have been answered. All parties are agreeable.       Notice: This dictation was prepared with Dragon dictation along with smaller phrase technology. Any transcriptional errors that result from this process are unintentional and may not be corrected upon review.

## 2024-04-03 NOTE — Anesthesia Preprocedure Evaluation (Signed)
 Anesthesia Evaluation  Patient identified by MRN, date of birth, ID band Patient awake    Reviewed: Allergy & Precautions, H&P , NPO status , Patient's Chart, lab work & pertinent test results, reviewed documented beta blocker date and time   Airway Mallampati: II  TM Distance: >3 FB Neck ROM: full    Dental no notable dental hx.    Pulmonary sleep apnea , COPD, former smoker   Pulmonary exam normal breath sounds clear to auscultation       Cardiovascular Exercise Tolerance: Good hypertension,  Rhythm:regular Rate:Normal     Neuro/Psych  PSYCHIATRIC DISORDERS   Bipolar Disorder   negative neurological ROS     GI/Hepatic negative GI ROS, Neg liver ROS,,,  Endo/Other  diabetes    Renal/GU negative Renal ROS  negative genitourinary   Musculoskeletal   Abdominal   Peds  Hematology negative hematology ROS (+)   Anesthesia Other Findings   Reproductive/Obstetrics negative OB ROS                             Anesthesia Physical Anesthesia Plan  ASA: 3  Anesthesia Plan: General   Post-op Pain Management:    Induction:   PONV Risk Score and Plan: Propofol infusion  Airway Management Planned:   Additional Equipment:   Intra-op Plan:   Post-operative Plan:   Informed Consent: I have reviewed the patients History and Physical, chart, labs and discussed the procedure including the risks, benefits and alternatives for the proposed anesthesia with the patient or authorized representative who has indicated his/her understanding and acceptance.     Dental Advisory Given  Plan Discussed with: CRNA  Anesthesia Plan Comments:        Anesthesia Quick Evaluation

## 2024-04-03 NOTE — Op Note (Signed)
 [facility] Patient Name: Gary Burke Procedure Date: 04/03/2024 9:36 AM MRN: [id number] Date of Birth: 17-Jun-1961 Attending MD: Gemma Kelp , MD, [id number] CSN: [id number] Age: 63 Admit Type: Outpatient Procedure:                Colonoscopy Indications:              High risk colon cancer surveillance: Personal                            history of colonic polyps Providers:                Gemma Kelp, MD, Gary Lazier RN, RN,                            Gary Burke, Technician Referring MD:             Gemma Kelp, MD Medicines:                Propofol per Anesthesia Complications:            No immediate complications. Estimated Blood Loss:     Estimated blood loss was minimal. Procedure:                Pre-Anesthesia Assessment:                           - Prior to the procedure, a History and Physical                            was performed, and patient medications and                            allergies were reviewed. The patient's tolerance of                            previous anesthesia was also reviewed. The risks                            and benefits of the procedure and the sedation                            options and risks were discussed with the patient.                            All questions were answered, and informed consent                            was obtained. Prior Anticoagulants: The patient                            last took Lovenox  (enoxaparin ) 1 day prior to the                            procedure. ASA Grade Assessment: III - A patient  with severe systemic disease. After reviewing the                            risks and benefits, the patient was deemed in                            satisfactory condition to undergo the procedure.                           After obtaining informed consent, the colonoscope                            was passed under direct vision. Throughout the                             procedure, the patient's blood pressure, pulse, and                            oxygen saturations were monitored continuously. The                            [id number]) scope was introduced through the                            anus and advanced to the the sigmoid colon. The                            rectum was photographed. Scope In: 10:16:57 AM Scope Out: 10:19:04 AM Total Procedure Duration: 0 hours 2 minutes 7 seconds  Findings:      The perianal and digital rectal examinations were normal. Formed stool       in the rectum and sigmoid which precluded performing a colonoscopy today. Impression:               - No specimens collected. Colonoscopy prep                            inadequate. Not performed. Moderate Sedation:      Moderate (conscious) sedation was personally administered by an       anesthesia professional. The following parameters were monitored: oxygen       saturation, heart rate, blood pressure, respiratory rate, EKG, adequacy       of pulmonary ventilation, and response to care. Recommendation:           - Patient has a contact number available for                            emergencies. The signs and symptoms of potential                            delayed complications were discussed with the                            patient. Return to normal activities tomorrow.  Written discharge instructions were provided to the                            patient.                           - Advance diet as tolerated. Lovenox  180 mg subcu                            today and tomorrow then stop. Resume Eliquis  today.                            Office follow-up with Gary Burke in 4 to 6 weeks. Procedure Code(s):        --- Professional ---                           [phone], 53, Colonoscopy, flexible; diagnostic,                            including collection of specimen(s) by brushing or                            washing,  when performed (separate procedure) Diagnosis Code(s):        --- Professional ---                           Z86.010, Personal history of colonic polyps CPT copyright 2022 [employer]. All rights reserved. The codes documented in this report are preliminary and upon coder review may  be revised to meet current compliance requirements. Gary Burke. Gary Iannaccone, MD Gemma Kelp, MD 04/03/2024 10:32:20 AM This report has been signed electronically. Number of Addenda: 0

## 2024-04-03 NOTE — Transfer of Care (Signed)
 Immediate Anesthesia Transfer of Care Note  Patient: Gary Burke  Procedure(s) Performed: COLONOSCOPY  Patient Location: Short Stay  Anesthesia Type:General  Level of Consciousness: awake, alert , and oriented  Airway & Oxygen Therapy: Patient Spontanous Breathing  Post-op Assessment: Report given to RN, Post -op Vital signs reviewed and stable, Patient moving all extremities X 4, and Patient able to stick tongue midline  Post vital signs: Reviewed and stable  Last Vitals:  Vitals Value Taken Time  BP 102/69 04/03/24 1026  Temp 36.8 C 04/03/24 1026  Pulse 83 04/03/24 1026  Resp 19 04/03/24 1026  SpO2 93 % 04/03/24 1026    Last Pain:  Vitals:   04/03/24 1026  TempSrc: Oral  PainSc:       Patients Stated Pain Goal: 4 (04/03/24 0827)  Complications: No notable events documented.

## 2024-04-06 ENCOUNTER — Encounter (HOSPITAL_COMMUNITY): Payer: Self-pay | Admitting: Internal Medicine

## 2024-04-24 ENCOUNTER — Ambulatory Visit (INDEPENDENT_AMBULATORY_CARE_PROVIDER_SITE_OTHER): Admitting: Internal Medicine

## 2024-04-24 ENCOUNTER — Other Ambulatory Visit: Payer: Self-pay | Admitting: *Deleted

## 2024-04-24 ENCOUNTER — Encounter: Payer: Self-pay | Admitting: *Deleted

## 2024-04-24 VITALS — BP 135/80 | HR 76 | Temp 97.5°F | Ht 72.0 in | Wt 260.0 lb

## 2024-04-24 DIAGNOSIS — Z09 Encounter for follow-up examination after completed treatment for conditions other than malignant neoplasm: Secondary | ICD-10-CM

## 2024-04-24 DIAGNOSIS — Z860101 Personal history of adenomatous and serrated colon polyps: Secondary | ICD-10-CM | POA: Diagnosis not present

## 2024-04-24 DIAGNOSIS — Z860100 Personal history of colon polyps, unspecified: Secondary | ICD-10-CM

## 2024-04-24 MED ORDER — PEG 3350-KCL-NA BICARB-NACL 420 G PO SOLR: 4000.0000 mL | Freq: Once | ORAL | 0 refills | Status: AC

## 2024-04-24 NOTE — Progress Notes (Unsigned)
 Primary Care Physician:  Alston Silvio BROCKS, FNP Primary Gastroenterologist:  Dr. Shaaron  Pre-Procedure History & Physical: HPI:  Gary Burke is a 63 y.o. male here for rescheduling surveillance colonoscopy.  [place].  Attempted recent surveillance colonoscopy prep was poor.  He took a low volume prep.  He is now motivated to attain an adequate preparation and to have a high-quality surveillance colonoscopy performed.  Past Medical History:  Diagnosis Date   Bipolar disorder (HCC)    COPD (chronic obstructive pulmonary disease) (HCC)    Diabetes mellitus without complication (HCC) 04/2016   DVT (deep vein thrombosis) in pregnancy 09/2023   Hypertension    Pulmonary embolism (HCC)    L lung x2   Sleep apnea     Past Surgical History:  Procedure Laterality Date   APPENDECTOMY  age 11   COLONOSCOPY N/A 04/03/2024   Procedure: COLONOSCOPY;  Surgeon: Shaaron Lamar HERO, MD;  Location: [facility];  Service: Endoscopy;  Laterality: N/A;  10;00 am, asa 3    Prior to Admission medications   Medication Sig Start Date End Date Taking? Authorizing Provider  ACCU-CHEK GUIDE TEST test strip 1 each by Other route 2 (two) times daily. 01/07/24  Yes [provider]  albuterol  (PROVENTIL ) (2.5 MG/3ML) 0.083% nebulizer solution Take 3 mLs (2.5 mg total) by nebulization every 6 (six) hours as needed for wheezing or shortness of breath. 10/16/22  Yes Comer Kirsch, PA-C  albuterol  (VENTOLIN  HFA) 108 (90 Base) MCG/ACT inhaler Inhale 1-2 puffs into the lungs every 6 (six) hours as needed for wheezing or shortness of breath. 10/16/22  Yes Comer Kirsch, PA-C  apixaban  (ELIQUIS ) 5 MG TABS tablet Take 1 tablet (5 mg total) by mouth 2 (two) times daily. 10/16/22  Yes Comer Kirsch, PA-C  atorvastatin  (LIPITOR) 40 MG tablet Take 1 tablet (40 mg total) by mouth daily. 10/16/22  Yes Comer Kirsch, PA-C  escitalopram (LEXAPRO) 10 MG tablet  Take 10 mg by mouth daily.   Yes [provider]  fenofibrate (TRICOR) 145 MG tablet Take 145 mg by mouth daily. 03/27/23  Yes [provider]  fluticasone -salmeterol (ADVAIR) 500-50 MCG/ACT AEPB Inhale 1 puff into the lungs in the morning and at bedtime. 10/16/22  Yes Comer Kirsch, PA-C  hydrocortisone 2.5 % cream Apply topically 3 (three) times daily. 04/24/23  Yes [provider]  insulin  glargine (LANTUS  SOLOSTAR) 100 UNIT/ML Solostar Pen Inject 75 Units into the skin daily. 07/11/23  Yes Therisa Benton PARAS, NP  Insulin  Pen Needle (PEN NEEDLES) 31G X 6 MM MISC Use to inject insulin  once daily 07/11/23  Yes Reardon, Benton PARAS, NP  ketoconazole  (NIZORAL ) 2 % cream Apply 1 Application topically daily. 03/26/24  Yes Semon, Jake L, DPM  levocetirizine (XYZAL) 5 MG tablet Take 5 mg by mouth every evening.   Yes [provider]  losartan  (COZAAR ) 25 MG tablet Take 1 tablet (25 mg total) by mouth daily. 10/16/22  Yes Comer Kirsch, PA-C  meclizine (ANTIVERT) 25 MG tablet Take 25 mg by mouth 3 (three) times daily as needed for dizziness or nausea. 07/11/22  Yes [provider]  metFORMIN (GLUCOPHAGE) 500 MG tablet Take 500 mg by mouth daily. 02/10/24  Yes [provider]  metoprolol  tartrate (LOPRESSOR ) 25 MG tablet Take 1 tablet (25 mg total) by mouth 2 (two) times daily. 10/16/22  Yes Comer Kirsch, PA-C  nystatin (MYCOSTATIN/NYSTOP) powder Apply 1 Application topically 2 (two) times  daily.   Yes [provider]  OZEMPIC , 2 MG/DOSE, 8 MG/3ML SOPN Inject 2 mg into the skin once a week. 02/10/24  Yes [provider]  pantoprazole (PROTONIX) 40 MG tablet Take 40 mg by mouth daily. 03/26/23  Yes [provider]  tamsulosin  (FLOMAX ) 0.4 MG CAPS capsule Take 1 capsule (0.4 mg total) by mouth daily. 10/16/22  Yes Comer Kirsch, PA-C  traMADol  (ULTRAM ) 50 MG tablet Take 50 mg by mouth every 6 (six) hours as needed.   Yes  [provider]  enoxaparin  (LOVENOX ) 150 MG/ML injection Inject 1 mL (150mg ) into the skin on 04/02/24 at 8am along with the 30mg  dose to equal 180mg . You will get further instructions after your procedure. Patient not taking: Reported on 04/24/2024 03/04/24   Gary Sonny RAMAN, PA-C  enoxaparin  (LOVENOX ) 30 MG/0.3ML injection Inject 0.3 mLs (30mg ) into the skin on 04/02/24 at 8am along with the 150 mg dose to equal 180mg . You will get further instructions after your procedure. Patient not taking: Reported on 04/24/2024 03/04/24   Gary Sonny RAMAN, PA-C  Sod Picosulfate-Mag Ox-Cit Acd (CLENPIQ ) 10-3.5-12 MG-GM -GM/175ML SOLN Take 1 kit by mouth as directed. Patient not taking: Reported on 04/24/2024 05/15/23   Shaaron Lamar HERO, MD    Allergies as of 04/24/2024 - Review Complete 04/24/2024  Allergen Reaction Noted   Penicillins Rash 07/08/2012    Family History  Problem Relation Age of Onset   Diabetes Daughter        Diabetes Type 1   Colon cancer Neg Hx     Social History   Socioeconomic History   Marital status: Married    Spouse name: Not on file   Number of children: Not on file   Years of education: Not on file   Highest education level: Not on file  Occupational History   Not on file  Tobacco Use   Smoking status: Former    Current packs/day: 0.00    Average packs/day: 0.5 packs/day for 37.0 years (18.5 ttl pk-yrs)    Types: Cigarettes    Start date: 10/29/1977    Quit date: 10/29/2014    Years since quitting: 9.4   Smokeless tobacco: Never  Substance and Sexual Activity   Alcohol use: No   Drug use: No   Sexual activity: Yes    Birth control/protection: None  Other Topics Concern   Not on file  Social History Narrative   Not on file   Social Drivers of Health   Financial Resource Strain: Low Risk  (02/20/2023)   Received from [facility]   Overall Financial Resource Strain (CARDIA)    Difficulty of Paying Living Expenses: Not hard at all  Food Insecurity: No  Food Insecurity (11/11/2023)   Hunger Vital Sign    Worried About Running Out of Food in the Last Year: Never true    Ran Out of Food in the Last Year: Never true  Transportation Needs: No Transportation Needs (11/11/2023)   PRAPARE - Administrator, Civil Service (Medical): No    Lack of Transportation (Non-Medical): No  Physical Activity: Not on file  Stress: No Stress Concern Present (02/20/2023)   Received from [facility] - Occupational Stress Questionnaire    Feeling of Stress : Not at all  Social Connections: Not on file  Intimate Partner Violence: Not At Risk (11/11/2023)   Humiliation, Afraid, Rape, and Kick questionnaire    Fear of  Current or Ex-Partner: No    Emotionally Abused: No    Physically Abused: No    Sexually Abused: No    Review of Systems: See HPI, otherwise negative ROS  Physical Exam: BP 135/80   Pulse 76   Temp (!) 97.5 F (36.4 C)   Ht 6' (1.829 m)   Wt 260 lb (117.9 kg)   BMI 35.26 kg/m  General:   Alert,  Well-developed, well-nourished, pleasant and cooperative in NAD Neck:  Supple; no masses or thyromegaly. No significant cervical adenopathy. Lungs:  Clear throughout to auscultation.   No wheezes, crackles, or rhonchi. No acute distress. Heart:  Regular rate and rhythm; no murmurs, clicks, rubs,  or gallops. Abdomen: Non-distended, normal bowel sounds.  Soft and nontender without appreciable mass or hepatosplenomegaly.   Impression/Plan: 63 year old gentleman with a history of multiple colonic adenomas removed previously in need of a surveillance colonoscopy.  Attempted surveillance recently thwarted by poor prep.  Patient failed to attain adequate cleanout with low volume prep.  He is motivated to take a full volume prep so that his next attempted colonoscopy will be successful.  Recommendations of offered him a surveillance colonoscopy in the near future.  ASA 3.The risks, benefits,  limitations, alternatives and imponderables have been reviewed with the patient. Questions have been answered. All parties are agreeable.    He understands we will need to hold his Eliquis  for 2 days and undergo Lovenox  bridging to minimize the time off anticoagulation.  Further recommendations to follow.     Notice: This dictation was prepared with Dragon dictation along with smaller phrase technology. Any transcriptional errors that result from this process are unintentional and may not be corrected upon review.

## 2024-04-24 NOTE — Patient Instructions (Signed)
 Good to see you again today.  As discussed, we need to get you set up for repeat colonoscopy.  Very important to end up with a good prep.  4 L of GoLytely prep is probably the best way to go to ensure adequately prep colon.  ASA 3  Will plan to perform this in the near future.  Will need to hold Eliquis  x 2 days and bridged with Lovenox  as done previously.  Further recommendations to follow.

## 2024-04-24 NOTE — Progress Notes (Unsigned)
 Gary Burke

## 2024-04-27 ENCOUNTER — Telehealth (INDEPENDENT_AMBULATORY_CARE_PROVIDER_SITE_OTHER): Payer: Self-pay | Admitting: *Deleted

## 2024-04-27 NOTE — Telephone Encounter (Signed)
 Pt informed of pre-op appointment on Monday 05/18/24 arrive at 12:45 pm.

## 2024-05-12 ENCOUNTER — Ambulatory Visit: Admitting: Orthopedic Surgery

## 2024-05-12 ENCOUNTER — Encounter: Payer: Self-pay | Admitting: Orthopedic Surgery

## 2024-05-12 VITALS — BP 144/94

## 2024-05-12 DIAGNOSIS — M170 Bilateral primary osteoarthritis of knee: Secondary | ICD-10-CM | POA: Diagnosis not present

## 2024-05-12 DIAGNOSIS — M1712 Unilateral primary osteoarthritis, left knee: Secondary | ICD-10-CM

## 2024-05-12 DIAGNOSIS — M1711 Unilateral primary osteoarthritis, right knee: Secondary | ICD-10-CM

## 2024-05-12 NOTE — Patient Instructions (Signed)

## 2024-05-12 NOTE — Progress Notes (Signed)
 Return patient Visit  Assessment: Gary Burke is a 63 y.o. male with the following: 1.  Bilateral knee arthritis  Plan: Gary Burke has moderate degenerative changes in bilateral knees.  He is currently having a lot of pain.  He states the injections are very helpful.  We have provided relief for up to 3 months.  He is interested in repeat injections today.  These were completed without issue.  He also stated to the potential interest in total knee arthroplasty.  If you would like to discuss this further, he can be scheduled with Dr. Margrette at his next visit in order to have all questions answered.  Procedure note injection Right knee joint   Verbal consent was obtained to inject the right knee joint  Timeout was completed to confirm the site of injection.  The skin was prepped with alcohol and ethyl chloride was sprayed at the injection site.  A 21-gauge needle was used to inject 40 mg of Depo-Medrol  and 1% lidocaine  (3 cc) into the right knee using an anterolateral approach.  There were no complications. A sterile bandage was applied.   Procedure note injection Left knee joint   Verbal consent was obtained to inject the left knee joint  Timeout was completed to confirm the site of injection.  The skin was prepped with alcohol and ethyl chloride was sprayed at the injection site.  A 21-gauge needle was used to inject 40 mg of Depo-Medrol  and 1% lidocaine  (3 cc) into the left knee using an anterolateral approach.  There were no complications. A sterile bandage was applied.     Follow-up: Return if symptoms worsen or fail to improve.  Subjective:  Chief Complaint  Patient presents with   Injections    Injection bilatarel  knees.     History of Present Illness: Gary Burke is a 63 y.o. male who returns to clinic for repeat evaluation of bilateral knees.  He has known arthritis.  I have seen him several times.  I injected both of his knees greater than 3 months ago.  He  states his knees are bothering him currently.  He would like repeat injections today.  He states he could be interested in total knee arthroplasty in the future.   Review of Systems: No fevers or chills No numbness or tingling No chest pain No shortness of breath No bowel or bladder dysfunction No GI distress No headaches   Objective: BP (!) 144/94   Physical Exam:  General: Alert and oriented. and No acute distress. Gait: Right sided antalgic gait.  Bilateral knees without effusion.  Tenderness to palpation in the medial knee.  Mild varus alignment clinically.  This does react, with a little bit of laxity.  Negative Lachman bilaterally.  IMAGING: No new imaging obtained today   New Medications:  No orders of the defined types were placed in this encounter.     Oneil DELENA Horde, MD  05/12/2024 12:07 PM

## 2024-05-18 ENCOUNTER — Encounter (HOSPITAL_COMMUNITY)
Admission: RE | Admit: 2024-05-18 | Discharge: 2024-05-18 | Disposition: A | Discharge status: Home / Self Care | Source: Ambulatory Visit | Attending: Internal Medicine | Admitting: Internal Medicine

## 2024-05-18 ENCOUNTER — Encounter (HOSPITAL_COMMUNITY): Payer: Self-pay

## 2024-05-18 HISTORY — DX: Unspecified asthma, uncomplicated: J45.909

## 2024-05-18 NOTE — Patient Instructions (Addendum)
 Gary Burke  05/18/2024     @PREFPERIOPPHARMACY @   Your procedure is scheduled on 05/20/2024.   Report to [facility] at 8:00 A.M.   Call this number if you have problems the morning of surgery:   [phone]  If you experience any cold or flu symptoms such as cough, fever, chills, shortness of breath, etc. between now and your scheduled surgery, please notify us  at the above number.   Remember:   Please Follow the diet and prep instructions given to you by Dr Ivonne office.    You may drink clear liquids until 6:00 AM .  Clear liquids allowed are:                    Water, Juice (No red color; non-citric and without pulp; diabetics please choose diet or no sugar options), Carbonated beverages (diabetics please choose diet or no sugar options), Clear Tea (No creamer, milk, or cream, including half & half and powdered creamer), Black Coffee Only (No creamer, milk or cream, including half & half and powdered creamer), Plain Jell-O Only (No red color; diabetics please choose no sugar options), and Clear Sports drink (No red color; diabetics please choose diet or no sugar options)    Take these medicines the morning of surgery with A SIP OF WATER : Escitalopram, Metoprolol , Pantoprazole,Tamsulosin  and Tramadol    Last dose of Ozempic  should be on 05/12/2024 or before.   Last dose of Eliquis  should be on 05/18/2024 AM dose only   On 05/19/2024 Lovenox  180 mg at 8 AM   05/20/2024 No Eliquis  and No Lovenox     Do not wear jewelry, make-up or nail polish, including gel polish,  artificial nails, or any other type of covering on natural nails (fingers and  toes).  Do not wear lotions, powders, or perfumes, or deodorant.  Do not shave 48 hours prior to surgery.  Men may shave face and neck.  Do not bring valuables to the hospital.  [facility] is not responsible for any belongings or valuables.  Contacts, dentures or bridgework may not be worn into surgery.  Leave your suitcase in the  car.  After surgery it may be brought to your room.  For patients admitted to the hospital, discharge time will be determined by your treatment team.  Patients discharged the day of surgery will not be allowed to drive home.   Name and phone number of your driver:   Family  Special instructions:  N/A  Please read over the following fact sheets that you were given.  Care and Recovery After Surgery  Colonoscopy, Adult A colonoscopy is a procedure to look at the entire large intestine. This procedure is done using a long, thin, flexible tube that has a camera on the end. You may have a colonoscopy: As a part of normal colorectal screening. If you have certain symptoms, such as: A low number of red blood cells in your blood (anemia). Diarrhea that does not go away. Pain in your abdomen. Blood in your stool. A colonoscopy can help screen for and diagnose medical problems, including: An abnormal growth of cells or tissue (tumor). Abnormal growths within the lining of your intestine (polyps). Inflammation. Areas of bleeding. Tell your health care provider about: Any allergies you have. All medicines you are taking, including vitamins, herbs, eye drops, creams, and over-the-counter medicines. Any problems you or family members have had with anesthetic medicines. Any bleeding problems you have. Any surgeries you have had.  Any medical conditions you have. Any problems you have had with having bowel movements. Whether you are pregnant or may be pregnant. What are the risks? Generally, this is a safe procedure. However, problems may occur, including: Bleeding. Damage to your intestine. Allergic reactions to medicines given during the procedure. Infection. This is rare. What happens before the procedure? Eating and drinking restrictions Follow instructions from your health care provider about eating or drinking restrictions, which may include: A few days before the procedure: Follow  a low-fiber diet. Avoid nuts, seeds, dried fruit, raw fruits, and vegetables. 1-3 days before the procedure: Eat only gelatin dessert or ice pops. Drink only clear liquids, such as water, clear juice, clear broth or bouillon, black coffee or tea, or clear soft drinks or sports drinks. Avoid liquids that contain red or purple dye. The day of the procedure: Do not eat solid foods. You may continue to drink clear liquids until up to 2 hours before the procedure. Do not eat or drink anything starting 2 hours before the procedure, or within the time period that your health care provider recommends. Bowel prep If you were prescribed a bowel prep to take by mouth (orally) to clean out your colon: Take it as told by your health care provider. Starting the day before your procedure, you will need to drink a large amount of liquid medicine. The liquid will cause you to have many bowel movements of loose stool until your stool becomes almost clear or light green. If your skin or the opening between the buttocks (anus) gets irritated from diarrhea, you may relieve the irritation using: Wipes with medicine in them, such as adult wet wipes with aloe and vitamin E. A product to soothe skin, such as petroleum jelly. If you vomit while drinking the bowel prep: Take a break for up to 60 minutes. Begin the bowel prep again. Call your health care provider if you keep vomiting or you cannot take the bowel prep without vomiting. To clean out your colon, you may also be given: Laxative medicines. These help you have a bowel movement. Instructions for enema use. An enema is liquid medicine injected into your rectum. Medicines Ask your health care provider about: Changing or stopping your regular medicines or supplements. This is especially important if you are taking iron supplements, diabetes medicines, or blood thinners. Taking medicines such as aspirin and ibuprofen . These medicines can thin your blood. Do not  take these medicines unless your health care provider tells you to take them. Taking over-the-counter medicines, vitamins, herbs, and supplements. General instructions Ask your health care provider what steps will be taken to help prevent infection. These may include washing skin with a germ-killing soap. If you will be going home right after the procedure, plan to have a responsible adult: Take you home from the hospital or clinic. You will not be allowed to drive. Care for you for the time you are told. What happens during the procedure?  An IV will be inserted into one of your veins. You will be given a medicine to make you fall asleep (general anesthetic). You will lie on your side with your knees bent. A lubricant will be put on the tube. Then the tube will be: Inserted into your anus. Gently eased through all parts of your large intestine. Air will be sent into your colon to keep it open. This may cause some pressure or cramping. Images will be taken with the camera and will appear on a screen. A  small tissue sample may be removed to be looked at under a microscope (biopsy). The tissue may be sent to a lab for testing if any signs of problems are found. If small polyps are found, they may be removed and checked for cancer cells. When the procedure is finished, the tube will be removed. The procedure may vary among health care providers and hospitals. What happens after the procedure? Your blood pressure, heart rate, breathing rate, and blood oxygen level will be monitored until you leave the hospital or clinic. You may have a small amount of blood in your stool. You may pass gas and have mild cramping or bloating in your abdomen. This is caused by the air that was used to open your colon during the exam. If you were given a sedative during the procedure, it can affect you for several hours. Do not drive or operate machinery until your health care provider says that it is safe. It is  up to you to get the results of your procedure. Ask your health care provider, or the department that is doing the procedure, when your results will be ready. Summary A colonoscopy is a procedure to look at the entire large intestine. Follow instructions from your health care provider about eating and drinking before the procedure. If you were prescribed an oral bowel prep to clean out your colon, take it as told by your health care provider. During the colonoscopy, a flexible tube with a camera on its end is inserted into the anus and then passed into all parts of the large intestine. This information is not intended to replace advice given to you by your health care provider. Make sure you discuss any questions you have with your health care provider. Document Revised: 11/27/2022 Document Reviewed: 06/07/2021 Elsevier Patient Education  2024 [employer]   Monitored Anesthesia Care Anesthesia refers to the techniques, procedures, and medicines that help a person stay safe and comfortable during surgery. Monitored anesthesia care, or sedation, is one type of anesthesia. You may have sedation if you do not need to be asleep for your procedure. Procedures that use sedation may include: Surgery to remove cataracts from your eyes. A dental procedure. A biopsy. This is when a tissue sample is removed and looked at under a microscope. You will be watched closely during your procedure. Your level of sedation or type of anesthesia may be changed to fit your needs. Tell a health care provider about: Any allergies you have. All medicines you are taking, including vitamins, herbs, eye drops, creams, and over-the-counter medicines. Any problems you or family members have had with anesthesia. Any bleeding problems you have. Any surgeries you have had. Any medical conditions or illnesses you have. This includes sleep apnea, cough, fever, or the flu. Whether you are pregnant or may be pregnant. Whether  you use cigarettes, alcohol, or drugs. Any use of steroids, whether by mouth or as a cream. What are the risks? Your health care provider will talk with you about risks. These may include: Getting too much medicine (oversedation). Nausea. Allergic reactions to medicines. Trouble breathing. If this happens, a breathing tube may be used to help you breathe. It will be removed when you are awake and breathing on your own. Heart trouble. Lung trouble. Confusion that gets better with time (emergence delirium). What happens before the procedure? When to stop eating and drinking Follow instructions from your health care provider about what you may eat and drink. These may include: 8 hours  before your procedure Stop eating most foods. Do not eat meat, fried foods, or fatty foods. Eat only light foods, such as toast or crackers. All liquids are okay except energy drinks and alcohol. 6 hours before your procedure Stop eating. Drink only clear liquids, such as water, clear fruit juice, black coffee, plain tea, and sports drinks. Do not drink energy drinks or alcohol. 2 hours before your procedure Stop drinking all liquids. You may be allowed to take medicines with small sips of water. If you do not follow your health care provider's instructions, your procedure may be delayed or canceled. Medicines Ask your health care provider about: Changing or stopping your regular medicines. These include any diabetes medicines or blood thinners you take. Taking medicines such as aspirin and ibuprofen . These medicines can thin your blood. Do not take them unless your health care provider tells you to. Taking over-the-counter medicines, vitamins, herbs, and supplements. Testing You may have an exam or testing. You may have a blood or urine sample taken. General instructions Do not use any products that contain nicotine or tobacco for at least 4 weeks before the procedure. These products include cigarettes,  chewing tobacco, and vaping devices, such as e-cigarettes. If you need help quitting, ask your health care provider. If you will be going home right after the procedure, plan to have a responsible adult: Take you home from the hospital or clinic. You will not be allowed to drive. Care for you for the time you are told. What happens during the procedure?  Your blood pressure, heart rate, breathing, level of pain, and blood oxygen level will be monitored. An IV will be inserted into one of your veins. You may be given: A sedative. This helps you relax. Anesthesia. This will: Numb certain areas of your body. Make you fall asleep for surgery. You will be given medicines as needed to keep you comfortable. The more medicine you are given, the deeper your level of sedation will be. Your level of sedation may be changed to fit your needs. There are three levels of sedation: Mild sedation. At this level, you may feel awake and relaxed. You will be able to follow directions. Moderate sedation. At this level, you will be sleepy. You may not remember the procedure. Deep sedation. At this level, you will be asleep. You will not remember the procedure. How you get the medicines will depend on your age and the procedure. They may be given as: A pill. This may be taken by mouth (orally) or inserted into the rectum. An injection. This may be into a vein or muscle. A spray through the nose. After your procedure is over, the medicine will be stopped. The procedure may vary among health care providers and hospitals. What happens after the procedure? Your blood pressure, heart rate, breathing rate, and blood oxygen level will be monitored until you leave the hospital or clinic. You may feel sleepy, clumsy, or nauseous. You may not remember what happened during or after the procedure. Sedation can affect you for several hours. Do not drive or use machinery until your health care provider says that it is  safe. This information is not intended to replace advice given to you by your health care provider. Make sure you discuss any questions you have with your health care provider. Document Revised: 03/12/2022 Document Reviewed: 03/12/2022 Elsevier Patient Education  2024 [employer].

## 2024-05-20 ENCOUNTER — Encounter (HOSPITAL_COMMUNITY): Payer: Self-pay | Admitting: Internal Medicine

## 2024-05-20 ENCOUNTER — Ambulatory Visit (HOSPITAL_COMMUNITY): Payer: Self-pay | Admitting: Anesthesiology

## 2024-05-20 ENCOUNTER — Encounter (HOSPITAL_COMMUNITY)
Admission: RE | Disposition: A | Discharge status: Home / Self Care | Payer: Self-pay | Source: Home / Self Care | Attending: Internal Medicine

## 2024-05-20 ENCOUNTER — Ambulatory Visit (HOSPITAL_COMMUNITY)
Admission: RE | Admit: 2024-05-20 | Discharge: 2024-05-20 | Disposition: A | Discharge status: Home / Self Care | Attending: Internal Medicine | Admitting: Internal Medicine

## 2024-05-20 ENCOUNTER — Encounter (HOSPITAL_COMMUNITY): Payer: Self-pay | Admitting: Anesthesiology

## 2024-05-20 DIAGNOSIS — J4489 Other specified chronic obstructive pulmonary disease: Secondary | ICD-10-CM | POA: Insufficient documentation

## 2024-05-20 DIAGNOSIS — Z87891 Personal history of nicotine dependence: Secondary | ICD-10-CM | POA: Insufficient documentation

## 2024-05-20 DIAGNOSIS — Z1211 Encounter for screening for malignant neoplasm of colon: Secondary | ICD-10-CM | POA: Diagnosis not present

## 2024-05-20 DIAGNOSIS — Z7984 Long term (current) use of oral hypoglycemic drugs: Secondary | ICD-10-CM | POA: Diagnosis not present

## 2024-05-20 DIAGNOSIS — Z860100 Personal history of colon polyps, unspecified: Secondary | ICD-10-CM | POA: Diagnosis not present

## 2024-05-20 DIAGNOSIS — K649 Unspecified hemorrhoids: Secondary | ICD-10-CM | POA: Diagnosis not present

## 2024-05-20 DIAGNOSIS — Z833 Family history of diabetes mellitus: Secondary | ICD-10-CM | POA: Diagnosis not present

## 2024-05-20 DIAGNOSIS — I10 Essential (primary) hypertension: Secondary | ICD-10-CM | POA: Diagnosis not present

## 2024-05-20 DIAGNOSIS — K635 Polyp of colon: Secondary | ICD-10-CM | POA: Insufficient documentation

## 2024-05-20 DIAGNOSIS — E119 Type 2 diabetes mellitus without complications: Secondary | ICD-10-CM | POA: Diagnosis not present

## 2024-05-20 DIAGNOSIS — G4730 Sleep apnea, unspecified: Secondary | ICD-10-CM | POA: Insufficient documentation

## 2024-05-20 DIAGNOSIS — Z860101 Personal history of adenomatous and serrated colon polyps: Secondary | ICD-10-CM | POA: Diagnosis present

## 2024-05-20 DIAGNOSIS — Z7901 Long term (current) use of anticoagulants: Secondary | ICD-10-CM | POA: Diagnosis not present

## 2024-05-20 DIAGNOSIS — F319 Bipolar disorder, unspecified: Secondary | ICD-10-CM | POA: Diagnosis not present

## 2024-05-20 DIAGNOSIS — Z794 Long term (current) use of insulin: Secondary | ICD-10-CM | POA: Diagnosis not present

## 2024-05-20 HISTORY — PX: COLONOSCOPY: SHX5424

## 2024-05-20 LAB — GLUCOSE, CAPILLARY: Glucose-Capillary: 127 mg/dL — ABNORMAL HIGH (ref 70–99)

## 2024-05-20 SURGERY — COLONOSCOPY
Anesthesia: General

## 2024-05-20 MED ORDER — LACTATED RINGERS IV SOLN: INTRAVENOUS | Status: DC

## 2024-05-20 MED ORDER — PROPOFOL 10 MG/ML IV BOLUS
INTRAVENOUS | Status: DC | PRN
  Administered 2024-05-20: 100 mg via INTRAVENOUS

## 2024-05-20 MED ORDER — PROPOFOL 500 MG/50ML IV EMUL
INTRAVENOUS | Status: DC | PRN
  Administered 2024-05-20: 200 ug/kg/min via INTRAVENOUS

## 2024-05-20 NOTE — Interval H&P Note (Signed)
 History and Physical Interval Note:  05/20/2024 9:58 AM  Gary Burke  has presented today for surgery, with the diagnosis of history of polyps.  The various methods of treatment have been discussed with the patient and family. After consideration of risks, benefits and other options for treatment, the patient has consented to  Procedure(s) with comments: COLONOSCOPY (N/A) - 10:00 am, asa 3 as a surgical intervention.  The patient's history has been reviewed, patient examined, no change in status, stable for surgery.  I have reviewed the patient's chart and labs.  Questions were answered to the patient's satisfaction.     [name]    No change.   Eliquis  held Lovenox  bridge.  He is here for surveillance colonoscopy.  The risks, benefits, limitations, alternatives and imponderables have been reviewed with the patient. Questions have been answered. All parties are agreeable.

## 2024-05-20 NOTE — Anesthesia Preprocedure Evaluation (Signed)
 Anesthesia Evaluation  Patient identified by MRN, date of birth, ID band Patient awake    Reviewed: Allergy & Precautions, H&P , NPO status , Patient's Chart, lab work & pertinent test results, reviewed documented beta blocker date and time   Airway Mallampati: II  TM Distance: >3 FB Neck ROM: full    Dental no notable dental hx.    Pulmonary asthma , sleep apnea , COPD, former smoker   Pulmonary exam normal breath sounds clear to auscultation       Cardiovascular Exercise Tolerance: Good hypertension,  Rhythm:regular Rate:Normal     Neuro/Psych  PSYCHIATRIC DISORDERS   Bipolar Disorder   negative neurological ROS     GI/Hepatic negative GI ROS, Neg liver ROS,,,  Endo/Other  diabetes    Renal/GU negative Renal ROS  negative genitourinary   Musculoskeletal   Abdominal   Peds  Hematology negative hematology ROS (+)   Anesthesia Other Findings   Reproductive/Obstetrics negative OB ROS                              Anesthesia Physical Anesthesia Plan  ASA: 3  Anesthesia Plan: General   Post-op Pain Management:    Induction:   PONV Risk Score and Plan: Propofol  infusion  Airway Management Planned:   Additional Equipment:   Intra-op Plan:   Post-operative Plan:   Informed Consent: I have reviewed the patients History and Physical, chart, labs and discussed the procedure including the risks, benefits and alternatives for the proposed anesthesia with the patient or authorized representative who has indicated his/her understanding and acceptance.     Dental Advisory Given  Plan Discussed with: CRNA  Anesthesia Plan Comments:         Anesthesia Quick Evaluation

## 2024-05-20 NOTE — Discharge Instructions (Signed)
  Colonoscopy Discharge Instructions  Read the instructions outlined below and refer to this sheet in the next few weeks. These discharge instructions provide you with general information on caring for yourself after you leave the hospital. Your doctor may also give you specific instructions. While your treatment has been planned according to the most current medical practices available, unavoidable complications occasionally occur. If you have any problems or questions after discharge, call Dr. Shaaron at [phone]. ACTIVITY You may resume your regular activity, but move at a slower pace for the next 24 hours.  Take frequent rest periods for the next 24 hours.  Walking will help get rid of the air and reduce the bloated feeling in your belly (abdomen).  No driving for 24 hours (because of the medicine (anesthesia) used during the test).   Do not sign any important legal documents or operate any machinery for 24 hours (because of the anesthesia used during the test).  NUTRITION Drink plenty of fluids.  You may resume your normal diet as instructed by your doctor.  Begin with a light meal and progress to your normal diet. Heavy or fried foods are harder to digest and may make you feel sick to your stomach (nauseated).  Avoid alcoholic beverages for 24 hours or as instructed.  MEDICATIONS You may resume your normal medications unless your doctor tells you otherwise.  WHAT YOU CAN EXPECT TODAY Some feelings of bloating in the abdomen.  Passage of more gas than usual.  Spotting of blood in your stool or on the toilet paper.  IF YOU HAD POLYPS REMOVED DURING THE COLONOSCOPY: No aspirin products for 7 days or as instructed.  No alcohol for 7 days or as instructed.  Eat a soft diet for the next 24 hours.  FINDING OUT THE RESULTS OF YOUR TEST Not all test results are available during your visit. If your test results are not back during the visit, make an appointment with your caregiver to find out the  results. Do not assume everything is normal if you have not heard from your caregiver or the medical facility. It is important for you to follow up on all of your test results.  SEEK IMMEDIATE MEDICAL ATTENTION IF: You have more than a spotting of blood in your stool.  Your belly is swollen (abdominal distention).  You are nauseated or vomiting.  You have a temperature over 101.  You have abdominal pain or discomfort that is severe or gets worse throughout the day.    Your prep today was great!  1 polyp found and removed  Further recommendations to follow pending review of pathology report  May resume Eliquis  this evening.  No need for further Lovenox  shots  At patient request, I called Ginger Berthold at [phone] findings and recommendations

## 2024-05-20 NOTE — Op Note (Signed)
 [facility] Patient Name: Gary Burke Procedure Date: 05/20/2024 9:49 AM MRN: [id number] Date of Birth: 06-20-1961 Attending MD: Gary Ozell Hollingshead , MD, [id number] CSN: [id number] Age: 63 Admit Type: Inpatient Procedure:                Colonoscopy Indications:              High risk colon cancer surveillance: Personal                            history of colonic polyps Providers:                Gary Ozell Hollingshead, MD, Crystal Page, [place]                            Wilson, Technician, Bascom Blush Referring MD:             Gary Ozell Hollingshead, MD Medicines:                Propofol  per Anesthesia Complications:            No immediate complications. Estimated Blood Loss:     Estimated blood loss was minimal. Procedure:                Pre-Anesthesia Assessment:                           - Prior to the procedure, a History and Physical                            was performed, and patient medications and                            allergies were reviewed. The patient's tolerance of                            previous anesthesia was also reviewed. The risks                            and benefits of the procedure and the sedation                            options and risks were discussed with the patient.                            All questions were answered, and informed consent                            was obtained. Prior Anticoagulants: The patient has                            taken no anticoagulant or antiplatelet agents. ASA                            Grade Assessment: III - A patient with severe  systemic disease. After reviewing the risks and                            benefits, the patient was deemed in satisfactory                            condition to undergo the procedure.                           After obtaining informed consent, the colonoscope                            was passed under direct vision. Throughout the                             procedure, the patient's blood pressure, pulse, and                            oxygen saturations were monitored continuously. The                            [id number] scope was introduced through the                            anus and advanced to the the cecum, identified by                            appendiceal orifice and ileocecal valve. The                            colonoscopy was performed without difficulty. The                            patient tolerated the procedure well. The quality                            of the bowel preparation was adequate. The                            ileocecal valve, appendiceal orifice, and rectum                            were photographed. Scope In: 10:18:26 AM Scope Out: 10:37:29 AM Scope Withdrawal Time: 0 hours 10 minutes 55 seconds  Total Procedure Duration: 0 hours 19 minutes 3 seconds  Findings:      The perianal and digital rectal examinations were normal.      A 5 mm polyp was found in the sigmoid colon. The polyp was sessile. The       polyp was removed with a cold snare. Resection and retrieval were       complete. Estimated blood loss was minimal.      The exam was otherwise without abnormality on direct and retroflexion       views. Impression:               -  One 5 mm polyp in the sigmoid colon, removed with                            a cold snare. Resected and retrieved.                           - The examination was otherwise normal on direct                            and retroflexion views. Moderate Sedation:      Moderate (conscious) sedation was personally administered by an       anesthesia professional. The following parameters were monitored: oxygen       saturation, heart rate, blood pressure, respiratory rate, EKG, adequacy       of pulmonary ventilation, and response to care. Recommendation:           - Patient has a contact number available for                            emergencies. The  signs and symptoms of potential                            delayed complications were discussed with the                            patient. Return to normal activities tomorrow.                            Written discharge instructions were provided to the                            patient.                           - Advance diet as tolerated.                           - Continue present medications. Resume Eliquis  this                            evening. Lovenox  injections not needed.                           - Repeat colonoscopy date to be determined after                            pending pathology results are reviewed for                            surveillance.                           - Return to GI office (date not yet determined). Procedure Code(s):        --- Professional ---  54614, Colonoscopy, flexible; with removal of                            tumor(s), polyp(s), or other lesion(s) by snare                            technique Diagnosis Code(s):        --- Professional ---                           Z86.010, Personal history of colonic polyps                           D12.5, Benign neoplasm of sigmoid colon CPT copyright 2022 American Medical Association. All rights reserved. The codes documented in this report are preliminary and upon coder review may  be revised to meet current compliance requirements. Gary HERO. Krystan Northrop, MD Gary Ozell Hollingshead, MD 05/20/2024 11:03:10 AM This report has been signed electronically. Number of Addenda: 0

## 2024-05-20 NOTE — Transfer of Care (Signed)
 Immediate Anesthesia Transfer of Care Note  Patient: Gary Burke  Procedure(s) Performed: COLONOSCOPY  Patient Location: Short Stay  Anesthesia Type:General  Level of Consciousness: awake, alert , and patient cooperative  Airway & Oxygen Therapy: Patient Spontanous Breathing  Post-op Assessment: Report given to RN, Post -op Vital signs reviewed and stable, and Patient moving all extremities X 4  Post vital signs: Reviewed and stable  Last Vitals:  Vitals Value Taken Time  BP    Temp    Pulse    Resp    SpO2      Last Pain:  Vitals:   05/20/24 1013  PainSc: 0-No pain         Complications: No notable events documented.

## 2024-05-21 ENCOUNTER — Encounter (HOSPITAL_COMMUNITY): Payer: Self-pay | Admitting: Internal Medicine

## 2024-05-22 LAB — SURGICAL PATHOLOGY

## 2024-05-23 NOTE — Anesthesia Postprocedure Evaluation (Signed)
 Anesthesia Post Note  Patient: Gary Burke  Procedure(s) Performed: COLONOSCOPY  Patient location during evaluation: Phase II Anesthesia Type: General Level of consciousness: awake Pain management: pain level controlled Vital Signs Assessment: post-procedure vital signs reviewed and stable Respiratory status: spontaneous breathing and respiratory function stable Cardiovascular status: blood pressure returned to baseline and stable Postop Assessment: no headache and no apparent nausea or vomiting Anesthetic complications: no Comments: Late entry   No notable events documented.   Last Vitals:  Vitals:   05/20/24 0846 05/20/24 1042  BP: 126/88 105/73  Pulse:  76  Resp: 16 (!) 22  Temp: 36.5 C 36.6 C  SpO2: 95% 93%    Last Pain:  Vitals:   05/20/24 1042  TempSrc: Axillary  PainSc: 0-No pain                 Yvonna JINNY Bosworth

## 2024-05-25 ENCOUNTER — Ambulatory Visit: Payer: Self-pay | Admitting: Internal Medicine

## 2024-06-26 ENCOUNTER — Ambulatory Visit (INDEPENDENT_AMBULATORY_CARE_PROVIDER_SITE_OTHER): Admitting: Podiatry

## 2024-06-26 VITALS — Ht 72.0 in | Wt 259.9 lb

## 2024-06-26 DIAGNOSIS — M79675 Pain in left toe(s): Secondary | ICD-10-CM

## 2024-06-26 DIAGNOSIS — B351 Tinea unguium: Secondary | ICD-10-CM

## 2024-06-26 DIAGNOSIS — D689 Coagulation defect, unspecified: Secondary | ICD-10-CM

## 2024-06-26 DIAGNOSIS — E1142 Type 2 diabetes mellitus with diabetic polyneuropathy: Secondary | ICD-10-CM

## 2024-06-26 DIAGNOSIS — M79674 Pain in right toe(s): Secondary | ICD-10-CM | POA: Diagnosis not present

## 2024-06-26 NOTE — Progress Notes (Signed)
  Subjective:  Patient ID: Gary Burke, male    DOB: [birth year],  MRN: [id number]  Chief Complaint  Patient presents with   Diabetes    Return in about 3 months (around 06/26/2024) for Diabetic foot care and nail trimming.    63 y.o. male presents with the above complaint. History confirmed with patient. Patient presenting with pain related to dystrophic thickened elongated nails. Patient is unable to trim own nails related to nail dystrophy and/or mobility issues. Patient does have a history of T2DM.  Last A1c down to 7.4.  He denies history of pedal ulceration.  Does report intermittent numbness and tingling in his feet.    Objective:  Physical Exam: warm, good capillary refill, pedal hair growth absent nail exam onychomycosis of the toenails, onycholysis, and dystrophic nails DP pulses palpable, PT pulses palpable, protective sensation intact, and vibratory sensation diminished Left Foot:  Pain with palpation of nails due to elongation and dystrophic growth.  Right Foot: Pain with palpation of nails due to elongation and dystrophic growth.  Dry pedal skin noted diffusely. Musculoskeletal: No significant pedal deformity bilaterally with muscle strength 5/5 for all major muscles.  No symptomatic limitations in pedal range of motion.  Assessment:   1. Pain due to onychomycosis of toenails of both feet   2. Diabetic polyneuropathy associated with type 2 diabetes mellitus (HCC)   3. Coagulation defect (HCC)      Plan:  Patient was evaluated and treated and all questions answered.  # Diabetes with neuropathy -Patient educated on diabetes. Discussed proper diabetic foot care and discussed risks and complications of disease. Educated patient in depth on reasons to return to the office immediately should he/she discover anything concerning or new on the feet. All questions answered. Discussed proper shoes as well.  - Some loss of vibratory sensation - Discussed use of over-the-counter  moisturizing creams and lotions for dry pedal skin  #Onychomycosis with pain  -Nails palliatively debrided as below. -Educated on self-care - Anticoagulated on Eliquis   Procedure: Nail Debridement Rationale: Pain Type of Debridement: manual, sharp debridement. Instrumentation: Nail nipper, rotary burr. Number of Nails: 10  Return in about 3 months (around 09/26/2024) for Diabetic Foot Care.         Ethan Saddler, DPM [facility]

## 2024-06-27 ENCOUNTER — Encounter: Payer: Self-pay | Admitting: Podiatry

## 2024-07-14 ENCOUNTER — Other Ambulatory Visit: Payer: Self-pay | Admitting: Nurse Practitioner

## 2024-08-06 ENCOUNTER — Ambulatory Visit (INDEPENDENT_AMBULATORY_CARE_PROVIDER_SITE_OTHER): Admitting: Nurse Practitioner

## 2024-08-19 NOTE — Progress Notes (Signed)
 Assessment/Plan:    #Prior PE/DVT #mild PA enlargement on CT #COPD #OSA - evidence of treatment failure so will have to switch to warfarin from eliquis . Reports compliance with eliquis . Will refer to coumadin clinic, goal INR 2-3 - Discussed TTE to screen for pHTN (risk factors of COPD, OSA, obesity, prior PEs).   - Recent echo with no evidence of pulmonary hypertension 07/02/2024.  See report below - Take last dose of Eliquis  tonight and discontinue.  Discussed with patient - Start Coumadin 5 mg p.o. daily starting tomorrow -You have an appointment with Olam Rase, RN at [facility] Coumadin clinic on October 28 at 10 AM for PT/INR check.   #Ascending thoracic aortic aneurysm - 5.1 cm on recent CT 06/09/24 - Seen by CVTS on 07/02/2024.  Plan was for follow-up in 6 months with repeat CT and follow-up yearly thereafter.  Per CT surgery anticipate intervention ~5.5 cm - repeat CTA in 6 months - Echo 07/02/2024 with relatively small size LV moderately increased wall thickness, EF greater than 55% RV normal size normal function, ascending aorta mildly dilated at 4.8 cm.  Denied Fhx BAV, aortic dissection/rupture/aneurysm   #Atypical CP - multiple risk factors but recent ER presentation with non-ischemic ECG and negative troponins. Most consistent with noncardiac etiology - not having angina at prior visit, no indication for ischemic evaluation at this time per Dr. Lannette - At follow-up visit today he denies any anginal symptoms   #HTN - BP well-controlled today 130/80 - losartan25, tartrate 25 BID - at goal   #HLD #hyperTG #T2DM - control of diabetes has improved. A1c 13% -> 7.4%. on insulin  - Check lipid levels in 3 months - atorva 20 mg - fenofibrate 145mg    #remote tobacco use   #hepatic steatosis #BMI 35 - recommend lifestyle interventions as discussed - on ozempic ; weight has been trending up for past 4 months. Still ramping up ozempic  doses currently on starting dose.  Subjective:     Patient ID: Gary Burke is a 63 y.o.male.  HPI: Gary Burke is an 63 y.o. male patient with hx HTN/HLD, hyperTG, prior PE and DVT, mild PA enlargement, COPD, BMI 35, T2DM (13% -> 7.4%), OSA, hepatic steatosis, tobacco who presents for follow-up of chest pain. Fenofibrate started by PCP, atorvastatin  was stopped. He is doing well continues to walk his dog daily without any cardiopulmonary symptoms. He is eating healthier. Ozempic  he is on low dose slowly ramping up; has not had any weight loss yet. Denies angina, syncope, pre syncope, orthopnea/PND.   12/16 - 10/14/2023 admission for RLE DVT (while on eliquis ); CTPE negative.  12/2023 ER presentation for atypical CP with negative troponins and nonischemic ECG. CP resolved and was discharged home.    8/18: Had ED visit for cp and found to have left upper lobar pulmonary artery non-occlusive PE, 5.1 cm thoracic AA   Current meds: eliquis  5mg  BID, fenofibrate 145, atorva40 (stopped), insulin , losartan25, ozempic , tartrate 25mg  BID    Office visit 08/20/2024 In the interim since last office visit he presented to ED on June 21, 2024 with complaints of left-sided rib and lung pain been ongoing for the prior week.  He stated he saw a provider earlier in the week was told he had the start of an aneurysm on his left lung.  Patient stated he thought he was having an aneurysm now because it was painful.  Stated he also had been more short of breath.  He was evaluated and treated for atypical  chest pain/chest wall pain/rib pain.  He was given tramadol  and discharged.  He was seen by CT surgery on 07/02/2024 for ascending thoracic aortic aneurysm of 5.1 cm.  Plan per cardiothoracic surgery was to follow-up in 6 months with repeat CT scan and likely follow-up yearly thereafter.  Per cardiothoracic note anticipate intervention around 5.5 cm.  On 08/11/2024 he presented to [facility] ED with complaints of burning during urination for 2 days.   Urine culture was positive for E. coli.  He was started on Cipro 500 mg p.o. twice daily for possible UTI.  He was later discharged   Office visit 08/20/2024 He is here today for follow-up.  He recently saw CVTS for his thoracic ascending aortic aneurysm.  Which measured at 5.1 cm.  Plan per cardiothoracic surgery was to follow-up in 6 months with repeat CT scan and likely follow-up yearly thereafter.  Per CVTS office note anticipate intervention around 5.5 cm.  Today he states he is feeling very well.  Denies any palpitations or arrhythmias or shortness of breath.  States he had a recent visit to the emergency room with urinary tract infection which turned into prostatitis and has been taking medications for that and states symptoms have significantly improved.  Otherwise he is without complaint today blood pressure is well-controlled at 130/80.  EKG demonstrates sinus rhythm first-degree AV block rate of 82.  He states he is compliant with all of his medications.  He mentions that he had previously been on Xarelto with without any problems per his statement.  He states he is unsure as to why he was switched to Eliquis .  At last visit it was recommended he start Coumadin and see the Coumadin clinic.  Apparently based on office personnel statement his primary care provider at [facility] refused to start Coumadin because they did not monitor PTTs and INR's.  Instead they referred him to [facility] as a new patient..  Advised him we would prescribe Coumadin 5 mg p.o. daily to start tomorrow AM.  He can take his last Eliquis  dose tonight and advised him to throw away the bottle so he would not mistake it for another medication.  We spoke to the Coumadin clinic at [facility] heart care clinic [facility] office and he has a scheduled Coumadin clinic appointment on October 28 with Olam Rase, RN at [facility] Coumadin clinic at 10 AM.  He verbalizes understanding regarding stopping Eliquis  tonight after evening dose  and to pick up his Coumadin in the morning and start taking at 5 mg/day until seen by Olam Rase, RN at [facility] Coumadin clinic.  He states otherwise he feels good.  He states he will be receiving bilateral knee injections for bilateral knee arthritis within the next week with his orthopedist. He denies any unilateral leg swelling or shortness of breath.  Denies any tachycardia or pleuritic chest pain or shortness of breath.  Per Dr. Starlin note at last visit he has failed Eliquis  therapy and plan was to refer to PCP who would start Coumadin and he would need to follow-up at Coumadin clinic at [facility].   The following portions of the patient's history were reviewed and updated as appropriate: allergies, current medications, past family history, past medical history, past social history, past surgical history, and problem list.  Review of Systems  Constitutional: Negative.   HENT: Negative.    Eyes: Negative.   Respiratory: Negative.    Cardiovascular: Negative.   Gastrointestinal: Negative.  Endocrine: Negative.   Genitourinary: Negative.   Musculoskeletal: Negative.   Skin: Negative.   Allergic/Immunologic: Negative.   Neurological: Negative.   Hematological: Negative.   Psychiatric/Behavioral: Negative.         Objective:     There were no vitals filed for this visit. Wt Readings from Last 4 Encounters:  08/11/24 (!) 115.3 kg (254 lb 2 oz)  07/02/24 (!) 114.9 kg (253 lb 3.2 oz)  06/21/24 (!) 114.8 kg (253 lb 1.6 oz)  06/15/24 (!) 114.8 kg (253 lb)   Physical Exam Constitutional:      Appearance: Normal appearance.  HENT:     Head: Normocephalic.  Eyes:     Pupils: Pupils are equal, round, and reactive to light.  Cardiovascular:     Rate and Rhythm: Normal rate and regular rhythm.     Pulses: Normal pulses.     Heart sounds: Normal heart sounds.  Pulmonary:     Effort: Pulmonary effort is normal.     Breath sounds: Normal breath sounds.  Abdominal:      Palpations: Abdomen is soft.  Musculoskeletal:     Cervical back: Normal range of motion.     Right lower leg: No edema.     Left lower leg: No edema.  Skin:    General: Skin is warm and dry.     Capillary Refill: Capillary refill takes less than 2 seconds.  Neurological:     General: No focal deficit present.     Mental Status: He is alert and oriented to person, place, and time.  Psychiatric:        Mood and Affect: Mood normal.        Behavior: Behavior normal.        Thought Content: Thought content normal.        Judgment: Judgment normal.       Medications:   Current Outpatient Medications  Medication Instructions  . ACCU-CHEK GUIDE TEST STRIPS Strp Once  . albuterol  HFA 90 mcg/actuation inhaler 1-2 puffs, Every 4 hours PRN  . albuterol  2.5 mg, Every 4 hours PRN  . atorvastatin  (LIPITOR) 20 mg, Oral, Daily (standard)  . ciprofloxacin HCl (CIPRO) 500 mg, Oral, 2 times a day (standard)  . ELIQUIS  5 mg Tab 1 tablet Orally Twice a day; Duration: 90 days  . escitalopram oxalate (LEXAPRO) 10 MG tablet 1 tablet, Daily (standard)  . fenofibrate (TRICOR) 145 mg, Daily (standard)  . fluticasone  propion-salmeterol (ADVAIR, WIXELA) 500-50 mcg/dose diskus 1 puff, Daily  . hydrocortisone 2.5 % cream 3 times a day (standard)  . LANTUS  SOLOSTAR U-100 INSULIN  75 Units, Subcutaneous, Nightly  . losartan  (COZAAR ) 25 MG tablet 1 tablet, Daily (standard)  . meclizine (ANTIVERT) 25 mg, Daily  . metoPROLOL  tartrate (LOPRESSOR ) 25 mg, 2 times a day (standard)  . nystatin (MYCOSTATIN) 100,000 unit/gram powder 1 Application, 2 times a day (standard)  . pantoprazole (PROTONIX) 40 mg, Oral, Every evening  . semaglutide  (OZEMPIC ) 2 mg, Weekly  . tamsulosin  (FLOMAX ) 0.4 mg capsule 1 capsule, As needed during procedure (one-step-med)  . traMADol  (ULTRAM ) 50 mg, Daily PRN    Procedures:   ECG: NSR first-degree AV block rate of 82  Holter/Event:    Echo: 07/02/2024 [facility] Summary   1.  The left ventricle is relatively small in size with moderately increased wall thickness.   2. The left ventricular systolic function is normal, LVEF is visually estimated at > 55%.   3. The right ventricle is normal in size,  with normal systolic function.   4. The ascending aorta is moderately dilated measuring 4.8 cm.    Coronary CT:    Stress test:    CMR:    Device check:     CTA 06/09/24: Impression  1.    Contrast mixing at the level of the distal right and left main pulmonary arteries, which suggests early imaging relative to contrast bolus. Noting this, there is a central filling defect within the proximal left upper lobar pulmonary artery which may represent a nonocclusive pulmonary embolism given downstream heterogeneous contrast enhancement. No other suspicious filling defects are noted at the bilateral lobar or segmental filling defects. No morphologic changes of right heart strain.  2.    Ascending thoracic aortic aneurysm is mildly increased since 2024, measuring up to 5.1 cm at the distal ascending segment. Vascular surgical consultation is recommended.  3.    Juxtapleural reticular interstitial thickening and subpleural cystic change of the nondependent right and left upper lobes, which may represent early interstitial lung disease.      The 10-year ASCVD risk score (Arnett DK, et al., 2019) is: 28.4%    Follow up:   No follow-ups on file.  Prentice Medley FNP Division of Cardiology  [facility]

## 2024-08-25 ENCOUNTER — Ambulatory Visit: Admitting: Orthopedic Surgery

## 2024-08-25 ENCOUNTER — Ambulatory Visit: Attending: Cardiology | Admitting: *Deleted

## 2024-08-25 DIAGNOSIS — I2699 Other pulmonary embolism without acute cor pulmonale: Secondary | ICD-10-CM

## 2024-08-25 DIAGNOSIS — Z5181 Encounter for therapeutic drug level monitoring: Secondary | ICD-10-CM

## 2024-08-25 DIAGNOSIS — Z86711 Personal history of pulmonary embolism: Secondary | ICD-10-CM

## 2024-08-25 LAB — POCT INR: INR: 1.1 — AB (ref 2.0–3.0)

## 2024-08-25 MED ORDER — WARFARIN SODIUM 5 MG PO TABS: 5.0000 mg | ORAL_TABLET | Freq: Every day | ORAL | 3 refills | Status: AC

## 2024-08-25 NOTE — Progress Notes (Signed)
 INR 1.1 Please see anticoagulation encounter

## 2024-08-25 NOTE — Patient Instructions (Addendum)
 Eliquis  was stopped at appt at [facility] on 08/20/24 Start warfarin 5mg  daily (1 tablet) Recheck in 1 week

## 2024-08-26 ENCOUNTER — Ambulatory Visit: Admitting: Orthopedic Surgery

## 2024-08-26 ENCOUNTER — Encounter: Payer: Self-pay | Admitting: Orthopedic Surgery

## 2024-08-26 DIAGNOSIS — M1712 Unilateral primary osteoarthritis, left knee: Secondary | ICD-10-CM

## 2024-08-26 DIAGNOSIS — M170 Bilateral primary osteoarthritis of knee: Secondary | ICD-10-CM

## 2024-08-26 DIAGNOSIS — F419 Anxiety disorder, unspecified: Secondary | ICD-10-CM | POA: Insufficient documentation

## 2024-08-26 DIAGNOSIS — I10 Essential (primary) hypertension: Secondary | ICD-10-CM | POA: Insufficient documentation

## 2024-08-26 DIAGNOSIS — M1711 Unilateral primary osteoarthritis, right knee: Secondary | ICD-10-CM

## 2024-08-26 DIAGNOSIS — E559 Vitamin D deficiency, unspecified: Secondary | ICD-10-CM | POA: Insufficient documentation

## 2024-08-26 DIAGNOSIS — E7800 Pure hypercholesterolemia, unspecified: Secondary | ICD-10-CM | POA: Insufficient documentation

## 2024-08-26 DIAGNOSIS — I719 Aortic aneurysm of unspecified site, without rupture: Secondary | ICD-10-CM | POA: Insufficient documentation

## 2024-08-26 MED ORDER — METHYLPREDNISOLONE ACETATE 40 MG/ML IJ SUSP
40.0000 mg | Freq: Once | INTRAMUSCULAR | Status: AC
  Administered 2024-08-26: 40 mg via INTRA_ARTICULAR

## 2024-08-26 NOTE — Progress Notes (Signed)
 Return patient Visit  Assessment: Gary Burke is a 63 y.o. male with the following: 1.  Bilateral knee arthritis  Plan: Gary Burke has moderate degenerative changes in bilateral knees.  Injection continues to be effective.  He states he waited too long to return, but would like to proceed with injections today.  All questions have been answered.  Injections were completed without issues.  Procedure note injection Right knee joint   Verbal consent was obtained to inject the right knee joint  Timeout was completed to confirm the site of injection.  The skin was prepped with alcohol and ethyl chloride was sprayed at the injection site.  A 21-gauge needle was used to inject 40 mg of Depo-Medrol  and 1% lidocaine  (3 cc) into the right knee using an anterolateral approach.  There were no complications. A sterile bandage was applied.   Procedure note injection Left knee joint   Verbal consent was obtained to inject the left knee joint  Timeout was completed to confirm the site of injection.  The skin was prepped with alcohol and ethyl chloride was sprayed at the injection site.  A 21-gauge needle was used to inject 40 mg of Depo-Medrol  and 1% lidocaine  (3 cc) into the left knee using an anterolateral approach.  There were no complications. A sterile bandage was applied.     Follow-up: Return if symptoms worsen or fail to improve.  Subjective:  Chief Complaint  Patient presents with   Injections    Both knees / (on Coumadin)    History of Present Illness: Gary Burke is a 63 y.o. male who returns to clinic for repeat evaluation of bilateral knees.  He has known arthritis.  He has had multiple injections.  They continue to be effective.  He states he is working on his A1c before considering surgery.   Review of Systems: No fevers or chills No numbness or tingling No chest pain No shortness of breath No bowel or bladder dysfunction No GI distress No  headaches   Objective: There were no vitals taken for this visit.  Physical Exam:  General: Alert and oriented. and No acute distress. Gait: Right sided antalgic gait.  Bilateral knees without effusion.  Tenderness to palpation in the medial knee.  Mild varus alignment clinically.  This does react, with a little bit of laxity.  Negative Lachman bilaterally.  IMAGING: No new imaging obtained today   New Medications:  Meds ordered this encounter  Medications   methylPREDNISolone  acetate (DEPO-MEDROL ) injection 40 mg   methylPREDNISolone  acetate (DEPO-MEDROL ) injection 40 mg      Gary DELENA Horde, MD  08/26/2024 1:41 PM

## 2024-08-26 NOTE — Patient Instructions (Signed)

## 2024-08-31 ENCOUNTER — Ambulatory Visit: Attending: Cardiology | Admitting: *Deleted

## 2024-08-31 DIAGNOSIS — Z86711 Personal history of pulmonary embolism: Secondary | ICD-10-CM | POA: Diagnosis not present

## 2024-08-31 DIAGNOSIS — Z5181 Encounter for therapeutic drug level monitoring: Secondary | ICD-10-CM | POA: Diagnosis not present

## 2024-08-31 DIAGNOSIS — I2699 Other pulmonary embolism without acute cor pulmonale: Secondary | ICD-10-CM | POA: Diagnosis not present

## 2024-08-31 LAB — POCT INR: INR: 1.5 — AB (ref 2.0–3.0)

## 2024-08-31 NOTE — Progress Notes (Signed)
 INR-1.5; Please see anticoagulation encounter

## 2024-08-31 NOTE — Patient Instructions (Signed)
 Eliquis  was stopped at appt at [facility] on 08/20/24 Increase warfarin to 1 tablet daily except 1 1/2 tablets on Mondays and Thursdays Recheck in 1 week

## 2024-09-07 ENCOUNTER — Ambulatory Visit: Attending: Cardiology | Admitting: *Deleted

## 2024-09-07 DIAGNOSIS — Z86711 Personal history of pulmonary embolism: Secondary | ICD-10-CM

## 2024-09-07 DIAGNOSIS — Z5181 Encounter for therapeutic drug level monitoring: Secondary | ICD-10-CM

## 2024-09-07 DIAGNOSIS — I2699 Other pulmonary embolism without acute cor pulmonale: Secondary | ICD-10-CM | POA: Diagnosis not present

## 2024-09-07 LAB — POCT INR: INR: 2.1 (ref 2.0–3.0)

## 2024-09-07 NOTE — Progress Notes (Signed)
 INR 2.1; Please see anticoagulation encounter

## 2024-09-07 NOTE — Patient Instructions (Signed)
 Continue warfarin 1 tablet daily except 1 1/2 tablets on Mondays and Thursdays Recheck in 1 week

## 2024-09-14 ENCOUNTER — Ambulatory Visit

## 2024-09-16 ENCOUNTER — Ambulatory Visit: Attending: Cardiology | Admitting: *Deleted

## 2024-09-16 DIAGNOSIS — Z5181 Encounter for therapeutic drug level monitoring: Secondary | ICD-10-CM

## 2024-09-16 DIAGNOSIS — I2699 Other pulmonary embolism without acute cor pulmonale: Secondary | ICD-10-CM

## 2024-09-16 DIAGNOSIS — Z86711 Personal history of pulmonary embolism: Secondary | ICD-10-CM

## 2024-09-16 LAB — POCT INR: INR: 2.1 (ref 2.0–3.0)

## 2024-09-16 NOTE — Progress Notes (Signed)
 INR 2.1; Please see anticoagulation encounter

## 2024-09-16 NOTE — Patient Instructions (Signed)
 Continue warfarin 1 tablet daily except 1 1/2 tablets on Mondays and Thursdays Recheck in 2 weeks

## 2024-09-22 ENCOUNTER — Other Ambulatory Visit: Payer: Self-pay

## 2024-09-22 ENCOUNTER — Emergency Department (HOSPITAL_COMMUNITY)
Admission: EM | Admit: 2024-09-22 | Discharge: 2024-09-22 | Disposition: A | Discharge status: Home / Self Care | Attending: Emergency Medicine | Admitting: Emergency Medicine

## 2024-09-22 ENCOUNTER — Emergency Department (HOSPITAL_COMMUNITY)

## 2024-09-22 ENCOUNTER — Encounter (HOSPITAL_COMMUNITY): Payer: Self-pay | Admitting: Emergency Medicine

## 2024-09-22 DIAGNOSIS — Z79899 Other long term (current) drug therapy: Secondary | ICD-10-CM | POA: Insufficient documentation

## 2024-09-22 DIAGNOSIS — E119 Type 2 diabetes mellitus without complications: Secondary | ICD-10-CM | POA: Diagnosis not present

## 2024-09-22 DIAGNOSIS — J4489 Other specified chronic obstructive pulmonary disease: Secondary | ICD-10-CM | POA: Insufficient documentation

## 2024-09-22 DIAGNOSIS — Z7984 Long term (current) use of oral hypoglycemic drugs: Secondary | ICD-10-CM | POA: Diagnosis not present

## 2024-09-22 DIAGNOSIS — I10 Essential (primary) hypertension: Secondary | ICD-10-CM | POA: Diagnosis not present

## 2024-09-22 DIAGNOSIS — J069 Acute upper respiratory infection, unspecified: Secondary | ICD-10-CM | POA: Diagnosis not present

## 2024-09-22 DIAGNOSIS — F17210 Nicotine dependence, cigarettes, uncomplicated: Secondary | ICD-10-CM | POA: Diagnosis not present

## 2024-09-22 DIAGNOSIS — Z794 Long term (current) use of insulin: Secondary | ICD-10-CM | POA: Insufficient documentation

## 2024-09-22 DIAGNOSIS — R059 Cough, unspecified: Secondary | ICD-10-CM | POA: Insufficient documentation

## 2024-09-22 DIAGNOSIS — R0981 Nasal congestion: Secondary | ICD-10-CM | POA: Diagnosis present

## 2024-09-22 DIAGNOSIS — Z7901 Long term (current) use of anticoagulants: Secondary | ICD-10-CM | POA: Diagnosis not present

## 2024-09-22 LAB — RESP PANEL BY RT-PCR (RSV, FLU A&B, COVID)  RVPGX2
Influenza A by PCR: NEGATIVE
Influenza B by PCR: NEGATIVE
Resp Syncytial Virus by PCR: NEGATIVE
SARS Coronavirus 2 by RT PCR: NEGATIVE

## 2024-09-22 MED ORDER — OXYMETAZOLINE HCL 0.05 % NA SOLN: 1.0000 | Freq: Two times a day (BID) | NASAL | 0 refills | Status: AC

## 2024-09-22 MED ORDER — ACETAMINOPHEN 325 MG PO TABS: 650.0000 mg | ORAL_TABLET | Freq: Four times a day (QID) | ORAL | 0 refills | Status: AC | PRN

## 2024-09-22 MED ORDER — GUAIFENESIN-DM 100-10 MG/5ML PO SYRP: 5.0000 mL | ORAL_SOLUTION | ORAL | 0 refills | Status: AC | PRN

## 2024-09-22 MED ORDER — PSEUDOEPHEDRINE HCL 60 MG PO TABS: 60.0000 mg | ORAL_TABLET | Freq: Four times a day (QID) | ORAL | 0 refills | Status: DC | PRN

## 2024-09-22 NOTE — ED Provider Notes (Signed)
 [place] EMERGENCY DEPARTMENT AT [facility] Provider Note  CSN: [id number] Arrival date & time: 09/22/24 9252  Chief Complaint(s) Nasal Congestion  HPI DASEAN BROW is a 63 y.o. male with past medical history as below, significant for bipolar disorder, COPD, DVT, PE who presents to the ED with complaint of rhinorrhea  Reports clear rhinorrhea over the past couple days, spouse with similar symptoms recently.  Occasional cough, nonproductive.  No fevers or chills, no chest pain or dyspnea.  No abdominal pain nausea or vomiting.  Compliant with his regular medications.  No leg swelling.  No fever.  Tolerant p.o. without difficulty.  No rash.  No recent travel.  Past Medical History Past Medical History:  Diagnosis Date   Asthma    Bipolar disorder (HCC)    COPD (chronic obstructive pulmonary disease) (HCC)    Diabetes mellitus without complication (HCC) 04/2016   DVT (deep vein thrombosis) in pregnancy 09/2023   Hypertension    Pulmonary embolism (HCC)    L lung x2   Sleep apnea    Patient Active Problem List   Diagnosis Date Noted   Anxiety 08/26/2024   Aortic aneurysm 08/26/2024   Hypertension 08/26/2024   Pure hypercholesterolemia 08/26/2024   Vitamin D deficiency 08/26/2024   Fatty liver 02/19/2024   Leukocytosis 11/08/2023   Acute deep vein thrombosis (DVT) of right lower extremity (HCC) 10/14/2023   Abnormal liver CT 05/03/2023   Aneurysm of ascending aorta without rupture 05/23/2022   Mixed hyperlipidemia 05/23/2022   BMI 40.0-44.9, adult (HCC) 04/24/2018   Left varicocele 09/26/2017   History of colonic polyps 07/05/2016   Abnormal chest CT 07/05/2016   Controlled type 2 diabetes mellitus without complication (HCC) 07/05/2016   Essential hypertension, benign 05/30/2016   History of pulmonary embolism 05/30/2016   Morbid obesity (HCC) 05/30/2016   Chronic obstructive pulmonary disease (HCC) 05/30/2016   Long term current use of anticoagulant therapy  05/30/2016   Chest pain 02/09/2016   Acute pulmonary embolism (HCC) 07/04/2015   Tobacco abuse 10/11/2014   Home Medication(s) Prior to Admission medications   Medication Sig Start Date End Date Taking? Authorizing Provider  ACCU-CHEK GUIDE TEST test strip 1 each by Other route 2 (two) times daily. 01/07/24   [provider]  albuterol  (PROVENTIL ) (2.5 MG/3ML) 0.083% nebulizer solution Take 3 mLs (2.5 mg total) by nebulization every 6 (six) hours as needed for wheezing or shortness of breath. 10/16/22   Comer Kirsch, PA-C  albuterol  (VENTOLIN  HFA) 108 (90 Base) MCG/ACT inhaler Inhale 1-2 puffs into the lungs every 6 (six) hours as needed for wheezing or shortness of breath. 10/16/22   Comer Kirsch, PA-C  atorvastatin  (LIPITOR) 40 MG tablet Take 1 tablet (40 mg total) by mouth daily. 10/16/22   Comer Kirsch, PA-C  escitalopram (LEXAPRO) 10 MG tablet Take 10 mg by mouth daily.    [provider]  fenofibrate (TRICOR) 145 MG tablet Take 145 mg by mouth daily. 03/27/23   [provider]  fluticasone -salmeterol (ADVAIR) 500-50 MCG/ACT AEPB Inhale 1 puff into the lungs in the morning and at bedtime. 10/16/22   Comer Kirsch, PA-C  hydrocortisone 2.5 % cream Apply topically 3 (three) times daily. 04/24/23   [provider]  Insulin  Pen Needle (PEN NEEDLES) 31G X 6 MM MISC Use to inject insulin  once daily 07/11/23   Therisa Benton PARAS, NP  ketoconazole  (NIZORAL ) 2 % cream Apply 1 Application topically daily. 03/26/24   Lamount Ethan CROME, DPM  LANTUS  LYNFORD  100 UNIT/ML Solostar Pen INJECT 75 UNITS UNDER THE SKIN DAILY 07/14/24   Therisa Benton PARAS, NP  levocetirizine (XYZAL) 5 MG tablet Take 5 mg by mouth every evening.    [provider]  losartan  (COZAAR ) 25 MG tablet Take 1 tablet (25 mg total) by mouth daily. 10/16/22   Comer Kirsch, PA-C  meclizine (ANTIVERT) 25 MG tablet Take 25 mg by mouth 3 (three) times daily as needed for dizziness or  nausea. 07/11/22   [provider]  metFORMIN (GLUCOPHAGE) 500 MG tablet Take 500 mg by mouth daily. 02/10/24   [provider]  metoprolol  tartrate (LOPRESSOR ) 25 MG tablet Take 1 tablet (25 mg total) by mouth 2 (two) times daily. 10/16/22   Comer Kirsch, PA-C  nystatin (MYCOSTATIN/NYSTOP) powder Apply 1 Application topically 2 (two) times daily.    [provider]  OZEMPIC , 2 MG/DOSE, 8 MG/3ML SOPN Inject 2 mg into the skin once a week. 02/10/24   [provider]  pantoprazole (PROTONIX) 40 MG tablet Take 40 mg by mouth daily. 03/26/23   [provider]  Sod Picosulfate-Mag Ox-Cit Acd (CLENPIQ ) 10-3.5-12 MG-GM -GM/175ML SOLN Take 1 kit by mouth as directed. 05/15/23   Rourk, Lamar HERO, MD  tamsulosin  (FLOMAX ) 0.4 MG CAPS capsule Take 1 capsule (0.4 mg total) by mouth daily. 10/16/22   Comer Kirsch, PA-C  traMADol  (ULTRAM ) 50 MG tablet Take 50 mg by mouth every 6 (six) hours as needed.    [provider]  warfarin (COUMADIN ) 5 MG tablet Take 1 tablet (5 mg total) by mouth daily. 08/25/24   Alvan Dorn FALCON, MD                                                                                                                                    Past Surgical History Past Surgical History:  Procedure Laterality Date   APPENDECTOMY  age 2   COLONOSCOPY N/A 04/03/2024   Procedure: COLONOSCOPY;  Surgeon: Shaaron Lamar HERO, MD;  [facility];  Service: Endoscopy;  Laterality: N/A;  10;00 am, asa 3   COLONOSCOPY N/A 05/20/2024   Procedure: COLONOSCOPY;  Surgeon: Shaaron Lamar HERO, MD;  Location: [facility];  Service: Endoscopy;  Laterality: N/A;  10:00 am, asa 3   Family History Family History  Problem Relation Age of Onset   Diabetes Daughter        Diabetes Type 1   Colon cancer Neg Hx     Social History Social History   Tobacco Use   Smoking status: Former    Current packs/day: 0.00    Average packs/day: 0.5 packs/day for  37.0 years (18.5 ttl pk-yrs)    Types: Cigarettes    Start date: 10/29/1977    Quit date: 10/29/2014    Years since quitting: 9.9   Smokeless tobacco: Never  Substance Use Topics   Alcohol use: No   Drug use: No  Allergies Penicillins  Review of Systems A thorough review of systems was obtained and all systems are negative except as noted in the HPI and PMH.   Physical Exam Vital Signs  I have reviewed the triage vital signs BP (!) 139/101   Pulse 83   Temp 98.1 F (36.7 C) (Axillary)   Ht 6' (1.829 m)   Wt 117 kg   SpO2 96%   BMI 34.98 kg/m  Physical Exam Vitals and nursing note reviewed.  Constitutional:      General: He is not in acute distress.    Appearance: He is well-developed.  HENT:     Head: Normocephalic and atraumatic.     Right Ear: External ear normal.     Left Ear: External ear normal.     Nose: Congestion and rhinorrhea (clear) present.     Mouth/Throat:     Mouth: Mucous membranes are moist.  Eyes:     General: No scleral icterus. Cardiovascular:     Rate and Rhythm: Normal rate and regular rhythm.     Pulses: Normal pulses.     Heart sounds: Normal heart sounds.  Pulmonary:     Effort: Pulmonary effort is normal. No respiratory distress.     Breath sounds: Normal breath sounds.  Abdominal:     General: Abdomen is flat.     Palpations: Abdomen is soft.     Tenderness: There is no abdominal tenderness. There is no guarding.  Musculoskeletal:     Cervical back: No rigidity.     Right lower leg: No edema.     Left lower leg: No edema.  Skin:    General: Skin is warm and dry.     Capillary Refill: Capillary refill takes less than 2 seconds.  Neurological:     Mental Status: He is alert.  Psychiatric:        Mood and Affect: Mood normal.        Behavior: Behavior normal.     ED Results and Treatments Labs (all labs ordered are listed, but only abnormal results are displayed) Labs Reviewed  RESP PANEL BY RT-PCR (RSV, FLU A&B, COVID)   RVPGX2                                                                                                                          Radiology DG Chest Portable 1 View Result Date: 09/22/2024 CLINICAL DATA:  cough EXAM: PORTABLE CHEST - 1 VIEW COMPARISON:  October 12, 2016 FINDINGS: Chronic coarsening of the pulmonary interstitium without overt pulmonary edema. No focal airspace consolidation, pleural effusion, or pneumothorax. Mild cardiomegaly. Tortuous aorta with aortic atherosclerosis. No acute fracture or destructive lesions. Multilevel thoracic osteophytosis. IMPRESSION: No acute cardiopulmonary abnormality. Electronically Signed   By: Rogelia Myers M.D.   On: 09/22/2024 08:53    Pertinent labs & imaging results that were available during my care of the patient were reviewed by me and considered in my medical decision making (see MDM  for details).  Medications Ordered in ED Medications - No data to display                                                                                                                                   Procedures Procedures  (including critical care time)  Medical Decision Making / ED Course    Medical Decision Making:    DAILYN KEMPNER is a 63 y.o. male with past medical history as below, significant for bipolar disorder, COPD, DVT, PE who presents to the ED with complaint of rhinorrhea. The complaint involves an extensive differential diagnosis and also carries with it a high risk of complications and morbidity.  Serious etiology was considered. Ddx includes but is not limited to: Viral syndrome, sinusitis, COVID, pneumonia, etc.  Complete initial physical exam performed, notably the patient was in acute distress, sitting upright on stretcher.    Reviewed and confirmed nursing documentation for past medical history, family history, social history.  Vital signs reviewed.    Viral URI with cough> - URI symptoms for the past 2 days, clear rhinorrhea,  nonproductive cough.  No fevers or rashes.  Spouse with similar symptoms - RVP was negative, chest x-ray was negative for - symptomatic control for home, encourage close follow-up with PCP, advised to isolate until symptoms resolve  9:39 AM:  I have discussed the diagnosis/risks/treatment options with the patient and family.  Evaluation and diagnostic testing in the emergency department does not suggest an emergent condition requiring admission or immediate intervention beyond what has been performed at this time.  They will follow up with pcp. We also discussed returning to the ED immediately if new or worsening sx occur. We discussed the sx which are most concerning (e.g., sudden worsening pain, fever, inability to tolerate by mouth, dib, cp) that necessitate immediate return.    The patient appears reasonably screened and/or stabilized for discharge and I doubt any other medical condition or other [facility] requiring further screening, evaluation, or treatment in the ED at this time prior to discharge.                        Additional history obtained: -Additional history obtained from spouse -External records from outside source obtained and reviewed including: Chart review including previous notes, labs, imaging, consultation notes including  Recent ER evaluation   Lab Tests: -I ordered, reviewed, and interpreted labs.   The pertinent results include:   Labs Reviewed  RESP PANEL BY RT-PCR (RSV, FLU A&B, COVID)  RVPGX2    Notable for negative RVP  EKG   EKG Interpretation Date/Time:    Ventricular Rate:    PR Interval:    QRS Duration:    QT Interval:    QTC Calculation:   R Axis:      Text Interpretation:           Imaging Studies ordered: I ordered  imaging studies including chest x-ray I independently visualized the following imaging with scope of interpretation limited to determining acute life threatening conditions related to emergency care; findings  noted above I agree with the radiologist interpretation If any imaging was obtained with contrast I closely monitored patient for any possible adverse reaction a/w contrast administration in the emergency department   Medicines ordered and prescription drug management: No orders of the defined types were placed in this encounter.   -I have reviewed the patients home medicines and have made adjustments as needed   Consultations Obtained: Not applicable  Cardiac Monitoring: Continuous pulse oximetry interpreted by myself, 98% on RA.    Social Determinants of Health:  Diagnosis or treatment significantly limited by social determinants of health: former smoker, obesity   Reevaluation: After the interventions noted above, I reevaluated the patient and found that they have stayed the same  Co morbidities that complicate the patient evaluation  Past Medical History:  Diagnosis Date   Asthma    Bipolar disorder (HCC)    COPD (chronic obstructive pulmonary disease) (HCC)    Diabetes mellitus without complication (HCC) 04/2016   DVT (deep vein thrombosis) in pregnancy 09/2023   Hypertension    Pulmonary embolism (HCC)    L lung x2   Sleep apnea       Dispostion: Disposition decision including need for hospitalization was considered, and patient discharged from emergency department.    Final Clinical Impression(s) / ED Diagnoses Final diagnoses:  Viral URI with cough        Elnor Jayson LABOR, DO 09/22/24 [phone]

## 2024-09-22 NOTE — ED Triage Notes (Signed)
 Pt c/o runny nose and cough x2 days. Afebrile.

## 2024-09-22 NOTE — Discharge Instructions (Addendum)
 It was a pleasure caring for you today in the emergency department.  You likely have a viral infection provoking your symptoms today.  I sent medication to your pharmacy to help with your symptoms.  Be sure to drink plenty of clear liquids over the next few days, get plenty of rest.  Please avoid contact with other individuals as you may get them sick.  Consider using a humidifier in your bedroom at nighttime.  Honey can also be very helpful to treat your cough  Please return to the emergency department for any worsening or worrisome symptoms.

## 2024-09-28 ENCOUNTER — Encounter: Payer: Self-pay | Admitting: Cardiology

## 2024-09-28 ENCOUNTER — Ambulatory Visit: Attending: Cardiology | Admitting: Cardiology

## 2024-09-28 VITALS — BP 126/86 | HR 93 | Ht 72.0 in | Wt 261.4 lb

## 2024-09-28 DIAGNOSIS — Z86711 Personal history of pulmonary embolism: Secondary | ICD-10-CM

## 2024-09-28 DIAGNOSIS — R0789 Other chest pain: Secondary | ICD-10-CM | POA: Diagnosis not present

## 2024-09-28 DIAGNOSIS — I7121 Aneurysm of the ascending aorta, without rupture: Secondary | ICD-10-CM | POA: Diagnosis not present

## 2024-09-28 NOTE — Patient Instructions (Addendum)
 Medication Instructions:   Continue all current medications.   Labwork:  none  Testing/Procedures:  none  Follow-Up:  6 months   Any Other Special Instructions Will Be Listed Below (If Applicable).  You have been referred to:  Cardiothoracic Surgery   If you need a refill on your cardiac medications before your next appointment, please call your pharmacy.

## 2024-09-28 NOTE — Progress Notes (Signed)
 Clinical Summary Gary Burke is a 63 y.o.male seen today as a new patient for the following medical problems.   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  09/2023 LE Venous US : +DVT right popliteal, posterior tibial, peroneal veins. This was while on eliquis .  - from [facility] notes at this time he was changed from eliquis  to coumadin . - no bleeding on coumadin    2. OSA   3. COPD   4.Thoracic aortic aneurysm - 05/2024 CT 5.1 cm ascending aortic aneurysm  previously seen by CT surgery at [facility], would like to establish with [facility] in [place]   5. Chest pain - 04/2022 nuclear stress at [facility] no ishcemia.  - denies any recent symptosms Past Medical History:  Diagnosis Date   Asthma    Bipolar disorder (HCC)    COPD (chronic obstructive pulmonary disease) (HCC)    Diabetes mellitus without complication (HCC) 04/2016   DVT (deep vein thrombosis) in pregnancy 09/2023   Hypertension    Pulmonary embolism (HCC)    L lung x2   Sleep apnea      Allergies  Allergen Reactions   Penicillins Rash    Has patient had a PCN reaction causing immediate rash, facial/tongue/throat swelling, SOB or lightheadedness with hypotension: Yes Has patient had a PCN reaction causing severe rash involving mucus membranes or skin necrosis: No Has patient had a PCN reaction that required hospitalization No Has patient had a PCN reaction occurring within the last 10 years: No  If all of the above answers are NO, then may proceed with Cephalosporin use.      Current Outpatient Medications  Medication Sig Dispense Refill   ACCU-CHEK GUIDE TEST test strip 1 each by Other route 2 (two) times daily.     acetaminophen  (TYLENOL ) 325 MG tablet Take 2 tablets (650 mg total) by mouth every 6 (six) hours as needed. 36 tablet 0   albuterol  (PROVENTIL ) (2.5 MG/3ML) 0.083% nebulizer solution Take 3 mLs (2.5 mg  total) by nebulization every 6 (six) hours as needed for wheezing or shortness of breath. 75 mL 0   albuterol  (VENTOLIN  HFA) 108 (90 Base) MCG/ACT inhaler Inhale 1-2 puffs into the lungs every 6 (six) hours as needed for wheezing or shortness of breath. 3 each 0   atorvastatin  (LIPITOR) 40 MG tablet Take 1 tablet (40 mg total) by mouth daily. 90 tablet 0   escitalopram (LEXAPRO) 10 MG tablet Take 10 mg by mouth daily.     fenofibrate (TRICOR) 145 MG tablet Take 145 mg by mouth daily.     fluticasone -salmeterol (ADVAIR) 500-50 MCG/ACT AEPB Inhale 1 puff into the lungs in the morning and at bedtime. 3 each 0   guaiFENesin -dextromethorphan (ROBITUSSIN DM) 100-10 MG/5ML syrup Take 5 mLs by mouth every 4 (four) hours as needed for cough. 118 mL 0   hydrocortisone 2.5 % cream Apply topically 3 (three) times daily.     Insulin  Pen Needle (PEN NEEDLES) 31G X 6 MM MISC Use to inject insulin  once daily 100 each 3   ketoconazole  (NIZORAL ) 2 % cream Apply 1 Application topically daily. 60 g 2   LANTUS  SOLOSTAR 100 UNIT/ML Solostar Pen INJECT 75 UNITS UNDER THE SKIN DAILY 60 mL 3   levocetirizine (XYZAL) 5 MG tablet Take 5 mg by mouth every evening.     losartan  (COZAAR ) 25 MG tablet Take 1 tablet (25 mg total) by  mouth daily. 90 tablet 0   meclizine (ANTIVERT) 25 MG tablet Take 25 mg by mouth 3 (three) times daily as needed for dizziness or nausea.     metFORMIN (GLUCOPHAGE) 500 MG tablet Take 500 mg by mouth daily.     metoprolol  tartrate (LOPRESSOR ) 25 MG tablet Take 1 tablet (25 mg total) by mouth 2 (two) times daily. 180 tablet 0   nystatin (MYCOSTATIN/NYSTOP) powder Apply 1 Application topically 2 (two) times daily.     OZEMPIC , 2 MG/DOSE, 8 MG/3ML SOPN Inject 2 mg into the skin once a week.     pantoprazole (PROTONIX) 40 MG tablet Take 40 mg by mouth daily.     pseudoephedrine  (SUDAFED) 60 MG tablet Take 1 tablet (60 mg total) by mouth every 6 (six) hours as needed for congestion. 14 tablet 0   Sod  Picosulfate-Mag Ox-Cit Acd (CLENPIQ ) 10-3.5-12 MG-GM -GM/175ML SOLN Take 1 kit by mouth as directed. 350 mL 0   tamsulosin  (FLOMAX ) 0.4 MG CAPS capsule Take 1 capsule (0.4 mg total) by mouth daily. 30 capsule 4   traMADol  (ULTRAM ) 50 MG tablet Take 50 mg by mouth every 6 (six) hours as needed.     warfarin (COUMADIN ) 5 MG tablet Take 1 tablet (5 mg total) by mouth daily. 45 tablet 3   No current facility-administered medications for this visit.     Past Surgical History:  Procedure Laterality Date   APPENDECTOMY  age 47   COLONOSCOPY N/A 04/03/2024   Procedure: COLONOSCOPY;  Surgeon: Shaaron Lamar HERO, MD;  [facility];  Service: Endoscopy;  Laterality: N/A;  10;00 am, asa 3   COLONOSCOPY N/A 05/20/2024   Procedure: COLONOSCOPY;  Surgeon: Shaaron Lamar HERO, MD;  [facility]: [facility];  Service: Endoscopy;  Laterality: N/A;  10:00 am, asa 3     Allergies  Allergen Reactions   Penicillins Rash    Has patient had a PCN reaction causing immediate rash, facial/tongue/throat swelling, SOB or lightheadedness with hypotension: Yes Has patient had a PCN reaction causing severe rash involving mucus membranes or skin necrosis: No Has patient had a PCN reaction that required hospitalization No Has patient had a PCN reaction occurring within the last 10 years: No  If all of the above answers are NO, then may proceed with Cephalosporin use.       Family History  Problem Relation Age of Onset   Diabetes Daughter        Diabetes Type 1   Colon cancer Neg Hx      Social History Gary Burke reports that he quit smoking about 9 years ago. His smoking use included cigarettes. He started smoking about 46 years ago. He has a 18.5 pack-year smoking history. He has never used smokeless tobacco. Gary Burke reports no history of alcohol use.    Physical Examination Today's Vitals   09/28/24 1458  BP: 126/86  Pulse: 93  SpO2: 96%  Weight: 261 lb 6.4 oz (118.6 kg)  Height: 6' (1.829 m)    Body mass index is 35.45 kg/m.  Gen: resting comfortably, no acute distress HEENT: no scleral icterus, pupils equal round and reactive, no palptable cervical adenopathy,  CV: RRR, no m/rg, no jvd Resp: Clear to auscultation bilaterally GI: abdomen is soft, non-tender, non-distended, normal bowel sounds, no hepatosplenomegaly MSK: extremities are warm, no edema.  Skin: warm, no rash Neuro:  no focal deficits Psych: appropriate affect   Diagnostic Studies 06/2024 echo [facility] Summary   1. The left ventricle  is relatively small in size with moderately increased  wall thickness.    2. The left ventricular systolic function is normal, LVEF is visually  estimated at > 55%.    3. The right ventricle is normal in size, with normal systolic function.    4. The ascending aorta is moderately dilated measuring 4.8 cm.    04/2022 nuclear stress [facility] 1. No evidence of reversible ischemia.  2. There is a small area of fixed perfusion defect in the mid to basal  inferior wall  3. Normal-sized left ventricle with preserved systolic function with  left ventricular ejection fraction of 57 %.  4. There is no evidence of transient ischemic dilation.  5. No focal or regional wall motion abnormalities.   03/2022 echo [facility]  FINAL IMPRESSION:  Left ventricular cavity size is normal.  Left ventricular wall thickness is mildly increased.  No regional wall motion abnormalities.  Global LV systolic function is normal, with an estimated ejection fraction  of 55-60%.  Diastolic function is  moderately abnormal (Grade II) with elevated LA  pressure.  There is no aortic stenosis.  There is mild aortic insufficiency.  There is mild tricuspid regurgitation.  Estimated pulmonary artery systolic pressure is 15-20 mmHg.  The right ventricle is normal in size with preserved contractility.  There is no pericardial effusion.  Mildly dilated aortic root and proximal ascending aorta     Assessment and Plan  1.Recurrent DVT/PE - from notes history of eliquis  failure, has been on coumadin  - tolerating coumadin  well, continue to follow in coumadin  clinic  2. History of aortic aneurysm - 5.1cm ascending aortic aneurysm by 05/2024 study at [facility] - seen by [facility] CT surgery, he would like to establish with [place]. Will make a referral  3.Chest pain - no recent symptoms, prior stress was 2023 was benign - continue to monitor  Request pcp labs      Dorn PHEBE Ross, M.D.

## 2024-09-30 ENCOUNTER — Ambulatory Visit

## 2024-09-30 ENCOUNTER — Encounter: Payer: Self-pay | Admitting: *Deleted

## 2024-10-02 ENCOUNTER — Ambulatory Visit: Admitting: Podiatry

## 2024-10-06 ENCOUNTER — Ambulatory Visit

## 2024-10-08 ENCOUNTER — Encounter

## 2024-10-12 ENCOUNTER — Ambulatory Visit

## 2024-10-13 ENCOUNTER — Ambulatory Visit: Attending: Cardiology

## 2024-10-13 DIAGNOSIS — I2699 Other pulmonary embolism without acute cor pulmonale: Secondary | ICD-10-CM

## 2024-10-13 DIAGNOSIS — Z86711 Personal history of pulmonary embolism: Secondary | ICD-10-CM

## 2024-10-13 DIAGNOSIS — Z5181 Encounter for therapeutic drug level monitoring: Secondary | ICD-10-CM

## 2024-10-13 LAB — POCT INR: INR: 1.4 — AB (ref 2.0–3.0)

## 2024-10-13 NOTE — Patient Instructions (Signed)
 Take warfarin 2 tablets tonight, take 1 1/2 tablets tomorrow night then resume 1 tablet daily except 1 1/2 tablets on Mondays and Thursdays Recheck in 1 week

## 2024-10-13 NOTE — Progress Notes (Signed)
 INR 1.4. Please see anticoagulation encounter

## 2024-10-15 ENCOUNTER — Institutional Professional Consult (permissible substitution)

## 2024-10-15 VITALS — BP 130/88 | HR 89 | Resp 20 | Ht 72.0 in | Wt 260.0 lb

## 2024-10-15 DIAGNOSIS — I7121 Aneurysm of the ascending aorta, without rupture: Secondary | ICD-10-CM

## 2024-10-15 NOTE — Patient Instructions (Signed)
 Risk Modification in those with ascending thoracic aortic aneurysm:   Continue control of blood pressure (prefer BP 130/80 or less)   2. Avoid fluoroquinolone antibiotics (I.e Ciprofloxacin, Avelox, Levofloxacin, Ofloxacin)   3.  Use of statin (to decrease cardiovascular risk)   4.  Exercise and activity limitations is individualized, but in general, contact sports are to be avoided and one should avoid heavy lifting (defined as half of ideal body weight) and exercises involving sustained Valsalva maneuver.   5.  Follow-up in March/April 2026 with CTA chest.

## 2024-10-15 NOTE — Progress Notes (Signed)
 [address] [place] [address]             [id number]            Gary Burke [id number] Aug 03, 1961   History of Present Illness:  Gary Burke is a 63 year old man with medical history of hypertension, pulmonary embolism, DVT, COPD, type 2 diabetes, former tobacco user, and hyperlipidemia who presents for continued follow up of ascending thoracic aortic aneurysm.  He was last seen in 06/2024 by [facility] and CTA of chest measured aneurysm at 5.1 cm.  This was increased from previous measurement at 4.7 cm. Echocardiogram from 06/2024 showed that the aortic valve is probably trileaflet and mildly thickened. There is trivial aortic regurgitation.   He presents to the clinic today with his wife and reports that he is doing well. His blood pressure is well controlled with current medication therapy. He does check his blood pressure at home and readings are 120s-130/80s.  He walks for exercise and denies heavy lifting. He denies chest pain, shortness of breath and lower leg swelling.   Medications Ordered Prior to Encounter[1]   ROS: Review of Systems  Constitutional: Negative.  Negative for malaise/fatigue.  Respiratory: Negative.  Negative for cough and shortness of breath.   Cardiovascular: Negative.  Negative for chest pain, palpitations and leg swelling.     BP 130/88   Pulse 89   Resp 20   Ht 6' (1.829 m)   Wt 260 lb (117.9 kg)   SpO2 96% Comment: RA  BMI 35.26 kg/m   Physical Exam Constitutional:      Appearance: Normal appearance.  HENT:     Head: Normocephalic and atraumatic.  Cardiovascular:     Rate and Rhythm: Normal rate and regular rhythm.     Heart sounds: Normal heart sounds, S1 normal and S2 normal.  Pulmonary:     Effort: Pulmonary effort is normal.     Breath sounds: Normal breath sounds.  Skin:    General: Skin is warm and dry.  Neurological:     General: No focal deficit present.     Mental Status: He is alert and  oriented to person, place, and time.      Imaging:  Graceann Thayer Senter, MD - 06/09/2024  Formatting of this note might be different from the original.  Exam:  CT Angiogram Chest (Pulmonary Embolism Protocol)   History:  Left sided chest pain, history of PE.   Technique: Chest CTA with IV contrast (pulmonary embolism protocol). This examination was specifically tailored to evaluate the pulmonary arteries for the presence of intraluminal thrombus.  Volumetric axial CT Maximum Intensity Projection (MIP) pulmonary angiographic images were generated at the scanner and sent to PACS for review.  AEC (automated exposure control) and/or manual techniques such as size-specific kV and mAs are employed where appropriate to reduce radiation exposure for all CT exams.   Comparison:  10/14/2023   Chest CT Findings:   PULMONARY ARTERIES: There is contrast mixing at the level of the distal right and left main pulmonary arteries, which suggests early imaging relative to contrast bolus. Allowing for this, there is a central filling defect within the proximal left upper lobar pulmonary artery (series 4 image 46) which may represent a nonocclusive pulmonary embolism given downstream heterogeneous contrast enhancement. No other suspicious filling defects are noted at the bilateral lobar or segmental filling defects. The pulmonary trunk and main pulmonary arteries  are normal in caliber.   LUNGS: Apical predominant paraseptal emphysema. Biapical pleural parenchymal scarring. Juxtapleural reticular interstitial thickening and subpleural cystic change of the nondependent right and left upper lobes. No consolidative attenuation or suspicious pulmonary masses. Mild dependent atelectasis. Scattered calcified granulomata. The central airways are clear.   PLEURA: No pleural effusion or pneumothorax.   HEART: Normal heart size. No pericardial effusion. No morphologic changes of right heart strain. Moderate coronary  artery calcifications.   AORTA: Ascending thoracic aortic aneurysm is mildly increased since 2024, measuring up to 5.1 cm at the distal ascending segment (previously 4.7 cm and measured in a similar manner). Scattered aortic branch calcified atherosclerosis.   MEDIASTINUM: No lymphadenopathy.   BONES: No aggressive lesion. No acute abnormality. Degenerative changes of the thoracic spine.   SOFT TISSUES: Normal.   UPPER ABDOMEN: Nonoptimized assessment of the upper abdomen secondary to contrast phase and field of view.   IMPRESSION:  1.   Contrast mixing at the level of the distal right and left main pulmonary arteries, which suggests early imaging relative to contrast bolus. Noting this, there is a central filling defect within the proximal left upper lobar pulmonary artery which may represent a nonocclusive pulmonary embolism given downstream heterogeneous contrast enhancement. No other suspicious filling defects are noted at the bilateral lobar or segmental filling defects. No morphologic changes of right heart strain.  2.    Ascending thoracic aortic aneurysm is mildly increased since 2024, measuring up to 5.1 cm at the distal ascending segment. Vascular surgical consultation is recommended.  3.    Juxtapleural reticular interstitial thickening and subpleural cystic change of the nondependent right and left upper lobes, which may represent early interstitial lung disease.   Patient Info  Name:     Gary Burke  Age:     62 years  DOB:     [birth year]  Gender:     Male  MRN:     [id number]  Accession #:     [id number]  Account #:     [id number]  Ht:     183 cm  Wt:     115 kg  BSA:     2.45 m2  BP:     123  /     88 mmHg  HR:     81 bpm  Heart Rhythm:     Sinus Rhythm  Exam Date:     07/02/2024 10:56 AM  Admit Date:     07/02/2024   Exam Type:     ECHOCARDIOGRAM W COLORFLOW SPECTRAL DOPPLER   Technical Quality:     Fair   Staff  Sonographer:    Fairy Guarneri  Referring  Physician:     Randine Norris Vernon-Platt  Ordering Physician:     Randine Norris Vernon-Platt   Study Info  Indications      - Ascending thoracic aortic aneurysm      I71.21 - Ascending aortic aneurysm,  unspecified whether ruptured  Procedure(s)   Complete two-dimensional, color flow and Doppler transthoracic  echocardiogram is performed.    Summary   1. The left ventricle is relatively small in size with moderately increased  wall thickness.    2. The left ventricular systolic function is normal, LVEF is visually  estimated at > 55%.    3. The right ventricle is normal in size, with normal systolic function.    4. The ascending aorta is moderately dilated measuring 4.8 cm.    Left Ventricle    The  left ventricle is relatively small in size with moderately increased  wall thickness. The left ventricular systolic function is normal, LVEF is  visually estimated at > 55%. Left ventricular diastolic function cannot be  accurately assessed.   Right Ventricle    The right ventricle is normal in size, with normal systolic function.    Left Atrium    The left atrium is normal in size.   Right Atrium    The right atrium is normal in size.    Aortic Valve    The aortic valve is probably trileaflet with mildly thickened leaflets with  normal excursion. There is trivial aortic regurgitation. There is no evidence  of a significant transvalvular gradient.   Mitral Valve    The mitral valve leaflets are poorly visualized but probably normal with  normal leaflet mobility. There is no significant mitral valve regurgitation.   Tricuspid Valve    The tricuspid valve leaflets are poorly visualized but probably normal, with  normal leaflet mobility. There is no significant tricuspid regurgitation. The  pulmonary systolic pressure cannot be estimated due to insufficient TR signal.   Pulmonic Valve    Pulmonary valve is not well visualized. There is no significant pulmonic   regurgitation. There is no evidence of a significant transvalvular gradient.    Aorta   The ascending aorta is moderately dilated measuring 4.8 cm.   Inferior Vena Cava    The IVC is suboptimally visualized but probably suggests normal right atrial  pressure.   Pericardium/Pleural   There is no pericardial effusion.    Ventricles  ----------------------------------------------------------------------  Name                                Value        Normal  ----------------------------------------------------------------------   LV Dimensions 2D/MM  ----------------------------------------------------------------------   IVS Diastolic Thickness  (2D)                                1.6 cm       0.6-1.0  LVID Diastole (2D)                  3.7 cm       4.2-5.8   LVPW Diastolic Thickness  (2D)                                1.9 cm       0.6-1.0  LVID Systole (2D)                   2.1 cm       2.5-4.0  LVOT Diameter                       2.4 cm                LV Mass Index (2D Cubed)          108 g/m2        49-115   Relative Wall Thickness  (2D)                                  1.00        <=0.42   RV Dimensions 2D/MM  ----------------------------------------------------------------------  TAPSE                              1.8 cm         >=1.7   Atria  ----------------------------------------------------------------------  Name                                Value        Normal  ----------------------------------------------------------------------   LA Dimensions  ----------------------------------------------------------------------  LA Dimension (2D)                   3.9 cm       3.0-4.1  LA Volume Index (4C A-L)        14.07 ml/m2                LA Volume Index (2C A-L)        11.84 ml/m2                 RA Dimensions  ----------------------------------------------------------------------  RA Area (4C)                       9.6 cm2        <=18.0  RA Area (4C)  Index              3.9 cm2/m2                RA ESV Index (4C MOD)              7 ml/m2         18-32   Left Ventricular Outflow Tract  ----------------------------------------------------------------------  Name                                Value        Normal  ----------------------------------------------------------------------   LVOT 2D  ----------------------------------------------------------------------  LVOT Diameter                       2.4 cm                LVOT Area                          4.5 cm2                 LVOT Doppler  ----------------------------------------------------------------------  LVOT Peak Velocity                 1.0 m/s   Aortic Valve  ----------------------------------------------------------------------  Name                                Value        Normal  ----------------------------------------------------------------------   AV Doppler  ----------------------------------------------------------------------  AV Peak Velocity                   0.9 m/s                AV Peak Gradient                    4 mmHg                AV Area (Cont  Eq Vel)              5.0 cm2                AV Area Index (Cont Eq Vel)     2.0 cm2/m2                AV DI (Vel)                           1.10   Mitral Valve  ----------------------------------------------------------------------  Name                                Value        Normal  ----------------------------------------------------------------------   MV Diastolic Function  ----------------------------------------------------------------------  MV E Peak Velocity                 55 cm/s                MV A Peak Velocity                 96 cm/s                MV E/A                                 0.6                 MV Annular TDI  ----------------------------------------------------------------------  MV Septal e' Velocity             5.2 cm/s         >=8.0  MV E/e' (Septal)                       10.5                MV Lateral e' Velocity            4.4 cm/s        >=10.0  MV E/e' (Lateral)                     12.6                MV e' Average                     4.8 cm/s                MV E/e' (Average)                     11.5   Pulmonic Valve  ----------------------------------------------------------------------  Name                                Value        Normal  ----------------------------------------------------------------------   PV Doppler  ----------------------------------------------------------------------  PV Peak Velocity                   0.9 m/s   Aorta  ----------------------------------------------------------------------  Name                                Value  Normal  ----------------------------------------------------------------------   Ascending Aorta  ----------------------------------------------------------------------  Ao Root Diameter (2D)               3.8 cm                Ao Root Diam Index (2D)          1.5 cm/m2                Ascending Aorta Diameter            4.5 cm    Report Signatures  Finalized by Augustin LITTIE Gearing  MD on 07/02/2024 03:58 PM     A/P:  Aneurysm of ascending aorta without rupture -5.1 cm ascending thoracic aortic aneurysm on CTA of chest on 05/2024. Echocardiogram showed that the aortic valve was likely normal in structure.  -We discussed the natural history and and risk factors for growth of ascending aortic aneurysms. Discussed recommendations to minimize the risk of further expansion or dissection including careful blood pressure control, avoidance of contact sports and heavy lifting, attention to lipid management.  We covered the importance of continued smoking cessation.  The patient does not yet meet surgical criteria of >5.5cm. The patient is aware of signs and symptoms of aortic dissection and when to present to the emergency department   -Follow up in March/April 2026 (6 months from  previous scan) with CTA of chest for continued surveillance   Risk Modification:  Statin:  atorvastatin   Smoking cessation instruction/counseling given:  commended patient for quitting and reviewed strategies for preventing relapses  Patient was counseled on importance of Blood Pressure Control  They are instructed to contact their Primary Care Physician if they start to have blood pressure readings over 130s/90s. Do not ever stop blood pressure medications on your own, unless instructed by healthcare professional.  Please avoid use of Fluoroquinolones as this can potentially increase your risk of Aortic Rupture and/or Dissection  Patient educated on signs and symptoms of Aortic Dissection, handout also provided in AVS  Manuelita CHRISTELLA Rough, PA-C 10/15/2024     [1]  Current Outpatient Medications on File Prior to Visit  Medication Sig Dispense Refill   ACCU-CHEK GUIDE TEST test strip 1 each by Other route 2 (two) times daily.     acetaminophen  (TYLENOL ) 325 MG tablet Take 2 tablets (650 mg total) by mouth every 6 (six) hours as needed. 36 tablet 0   albuterol  (PROVENTIL ) (2.5 MG/3ML) 0.083% nebulizer solution Take 3 mLs (2.5 mg total) by nebulization every 6 (six) hours as needed for wheezing or shortness of breath. 75 mL 0   albuterol  (VENTOLIN  HFA) 108 (90 Base) MCG/ACT inhaler Inhale 1-2 puffs into the lungs every 6 (six) hours as needed for wheezing or shortness of breath. 3 each 0   atorvastatin  (LIPITOR) 40 MG tablet Take 1 tablet (40 mg total) by mouth daily. 90 tablet 0   escitalopram (LEXAPRO) 10 MG tablet Take 10 mg by mouth daily.     fenofibrate (TRICOR) 145 MG tablet Take 145 mg by mouth daily.     fluticasone -salmeterol (ADVAIR) 500-50 MCG/ACT AEPB Inhale 1 puff into the lungs in the morning and at bedtime. 3 each 0   guaiFENesin -dextromethorphan (ROBITUSSIN DM) 100-10 MG/5ML syrup Take 5 mLs by mouth every 4 (four) hours as needed for cough. 118 mL 0   hydrocortisone 2.5 %  cream Apply topically 3 (three) times daily.     Insulin  Pen Needle (PEN NEEDLES) 31G X 6 MM MISC  Use to inject insulin  once daily 100 each 3   ketoconazole  (NIZORAL ) 2 % cream Apply 1 Application topically daily. 60 g 2   LANTUS  SOLOSTAR 100 UNIT/ML Solostar Pen INJECT 75 UNITS UNDER THE SKIN DAILY 60 mL 3   levocetirizine (XYZAL) 5 MG tablet Take 5 mg by mouth every evening.     losartan  (COZAAR ) 25 MG tablet Take 1 tablet (25 mg total) by mouth daily. 90 tablet 0   meclizine (ANTIVERT) 25 MG tablet Take 25 mg by mouth 3 (three) times daily as needed for dizziness or nausea.     metFORMIN (GLUCOPHAGE) 500 MG tablet Take 500 mg by mouth daily.     metoprolol  tartrate (LOPRESSOR ) 25 MG tablet Take 1 tablet (25 mg total) by mouth 2 (two) times daily. 180 tablet 0   nystatin (MYCOSTATIN/NYSTOP) powder Apply 1 Application topically 2 (two) times daily.     OZEMPIC , 2 MG/DOSE, 8 MG/3ML SOPN Inject 2 mg into the skin once a week.     tamsulosin  (FLOMAX ) 0.4 MG CAPS capsule Take 1 capsule (0.4 mg total) by mouth daily. 30 capsule 4   traMADol  (ULTRAM ) 50 MG tablet Take 50 mg by mouth every 6 (six) hours as needed.     warfarin (COUMADIN ) 5 MG tablet Take 1 tablet (5 mg total) by mouth daily. 45 tablet 3   No current facility-administered medications on file prior to visit.

## 2024-10-20 ENCOUNTER — Ambulatory Visit: Attending: Cardiology | Admitting: *Deleted

## 2024-10-20 DIAGNOSIS — Z5181 Encounter for therapeutic drug level monitoring: Secondary | ICD-10-CM | POA: Diagnosis not present

## 2024-10-20 DIAGNOSIS — Z86711 Personal history of pulmonary embolism: Secondary | ICD-10-CM | POA: Diagnosis not present

## 2024-10-20 DIAGNOSIS — I2699 Other pulmonary embolism without acute cor pulmonale: Secondary | ICD-10-CM | POA: Diagnosis not present

## 2024-10-20 LAB — POCT INR: INR: 2.4 (ref 2.0–3.0)

## 2024-10-20 NOTE — Progress Notes (Signed)
 INR 2.4 Please see anticoagulation encounter

## 2024-10-20 NOTE — Patient Instructions (Signed)
 Continue warfarin 1 tablet daily except 1 1/2 tablets on Mondays and Thursdays Recheck in 3 weeks

## 2024-10-23 ENCOUNTER — Ambulatory Visit: Payer: Self-pay | Admitting: Cardiology

## 2024-11-06 ENCOUNTER — Ambulatory Visit: Admitting: Podiatry

## 2024-11-06 ENCOUNTER — Encounter: Payer: Self-pay | Admitting: Podiatry

## 2024-11-06 DIAGNOSIS — B351 Tinea unguium: Secondary | ICD-10-CM

## 2024-11-06 DIAGNOSIS — M79674 Pain in right toe(s): Secondary | ICD-10-CM

## 2024-11-06 DIAGNOSIS — D689 Coagulation defect, unspecified: Secondary | ICD-10-CM

## 2024-11-06 DIAGNOSIS — M79675 Pain in left toe(s): Secondary | ICD-10-CM

## 2024-11-06 DIAGNOSIS — E1142 Type 2 diabetes mellitus with diabetic polyneuropathy: Secondary | ICD-10-CM

## 2024-11-06 NOTE — Progress Notes (Signed)
"  °  Subjective:  Patient ID: Gary Burke, male    DOB: Jan 05, 1961,  MRN: [id number]  Chief Complaint  Patient presents with   RFC    RFC nail trim. Diabetic.    64 y.o. male presents with the above complaint. History confirmed with patient. Patient presenting with pain related to dystrophic thickened elongated nails. Patient is unable to trim own nails related to nail dystrophy and/or mobility issues. Patient does have a history of T2DM.   He denies history of pedal ulceration.  Does report intermittent numbness and tingling in his feet.    Objective:  Physical Exam: warm, good capillary refill, pedal hair growth absent nail exam onychomycosis of the toenails, onycholysis, and dystrophic nails DP pulses palpable, PT pulses palpable, protective sensation intact, and vibratory sensation diminished Left Foot:  Pain with palpation of nails due to elongation and dystrophic growth.  Right Foot: Pain with palpation of nails due to elongation and dystrophic growth.  Dry pedal skin noted diffusely. Musculoskeletal: No significant pedal deformity bilaterally with muscle strength 5/5 for all major muscles.  No symptomatic limitations in pedal range of motion.  Assessment:   1. Pain due to onychomycosis of toenails of both feet   2. Diabetic polyneuropathy associated with type 2 diabetes mellitus (HCC)   3. Coagulation defect      Plan:  Patient was evaluated and treated and all questions answered.  # Diabetes with neuropathy Patient educated on diabetes. Discussed proper diabetic foot care and discussed risks and complications of disease. Educated patient in depth on reasons to return to the office immediately should he/she discover anything concerning or new on the feet. All questions answered. Discussed proper shoes as well.  - Recommend use of over-the-counter urea-based cream for dry cracked heels.  #Onychomycosis with pain  -Nails palliatively debrided as below. -Educated on self-care -  Diabetes with neuropathy.  Anticoagulated on warfarin  Procedure: Nail Debridement Rationale: Pain Type of Debridement: manual, sharp debridement. Instrumentation: Nail nipper, rotary burr. Number of Nails: 10   Return in about 3 months (around 02/04/2025) for Diabetic Foot Care.         Ethan Saddler, DPM [facility]                     "

## 2024-11-06 NOTE — Patient Instructions (Addendum)
 You can combine 3 tablespoons of coconut oil and 10-15 drops of tea tree oil together and apply to the toenails once a day.   Look for urea  40% cream or ointment and apply to the thickened dry skin / calluses. This can be bought over the counter, at a pharmacy or online such as [employer].

## 2024-11-10 ENCOUNTER — Ambulatory Visit: Attending: Cardiology | Admitting: *Deleted

## 2024-11-10 DIAGNOSIS — Z5181 Encounter for therapeutic drug level monitoring: Secondary | ICD-10-CM

## 2024-11-10 DIAGNOSIS — I2699 Other pulmonary embolism without acute cor pulmonale: Secondary | ICD-10-CM | POA: Diagnosis not present

## 2024-11-10 DIAGNOSIS — Z86711 Personal history of pulmonary embolism: Secondary | ICD-10-CM | POA: Diagnosis not present

## 2024-11-10 LAB — POCT INR: INR: 1.5 — AB (ref 2.0–3.0)

## 2024-11-10 NOTE — Progress Notes (Signed)
 INR-1.5; Please see anticoagulation encounter

## 2024-11-10 NOTE — Patient Instructions (Signed)
 Take warfarin 1 1/2 tablets tonight and tomorrow night then resume 1 tablet daily except 1 1/2 tablets on Mondays and Thursdays Pt states he is transferring care to [facility] for coumadin  management.  Thay have been trating him for a dog bit. Recheck in 1 week at [facility]

## 2024-11-18 ENCOUNTER — Telehealth: Payer: Self-pay | Admitting: *Deleted

## 2024-11-18 NOTE — Telephone Encounter (Signed)
 Pt wants to know if it is okay that he is from Warfarin 5 mg to eliquis  5 mg.

## 2024-11-18 NOTE — Telephone Encounter (Signed)
 Pt came by to let me know that he is changing to Eliquis  5mg  twice daily per MD at [facility] [place].  He is having his care managed there currently.  Pt removed from coumadin  clinic enrollment.

## 2024-11-30 ENCOUNTER — Ambulatory Visit

## 2024-12-01 ENCOUNTER — Ambulatory Visit

## 2024-12-01 DIAGNOSIS — Z5181 Encounter for therapeutic drug level monitoring: Secondary | ICD-10-CM | POA: Insufficient documentation

## 2024-12-01 DIAGNOSIS — Z86711 Personal history of pulmonary embolism: Secondary | ICD-10-CM

## 2024-12-01 DIAGNOSIS — I2699 Other pulmonary embolism without acute cor pulmonale: Secondary | ICD-10-CM | POA: Diagnosis not present

## 2024-12-01 DIAGNOSIS — I82401 Acute embolism and thrombosis of unspecified deep veins of right lower extremity: Secondary | ICD-10-CM

## 2024-12-01 LAB — POCT INR: INR: 1.1 — AB (ref 2.0–3.0)

## 2024-12-01 NOTE — Progress Notes (Signed)
INR 1.1

## 2024-12-09 ENCOUNTER — Ambulatory Visit

## 2025-02-04 ENCOUNTER — Ambulatory Visit: Admitting: Podiatry
